# Patient Record
Sex: Male | Born: 1940 | Race: White | Hispanic: No | Smoking: Current every day smoker
Health system: Southern US, Community
[De-identification: ages and names within clinical notes are randomized; demographics above are authoritative.]

## PROBLEM LIST (undated history)

## (undated) DIAGNOSIS — M1990 Unspecified osteoarthritis, unspecified site: Secondary | ICD-10-CM

## (undated) DIAGNOSIS — E559 Vitamin D deficiency, unspecified: Secondary | ICD-10-CM

## (undated) DIAGNOSIS — I739 Peripheral vascular disease, unspecified: Secondary | ICD-10-CM

## (undated) DIAGNOSIS — D751 Secondary polycythemia: Secondary | ICD-10-CM

## (undated) DIAGNOSIS — J449 Chronic obstructive pulmonary disease, unspecified: Secondary | ICD-10-CM

## (undated) DIAGNOSIS — E785 Hyperlipidemia, unspecified: Secondary | ICD-10-CM

## (undated) DIAGNOSIS — E119 Type 2 diabetes mellitus without complications: Secondary | ICD-10-CM

## (undated) DIAGNOSIS — K469 Unspecified abdominal hernia without obstruction or gangrene: Secondary | ICD-10-CM

## (undated) DIAGNOSIS — K5792 Diverticulitis of intestine, part unspecified, without perforation or abscess without bleeding: Secondary | ICD-10-CM

## (undated) DIAGNOSIS — I2510 Atherosclerotic heart disease of native coronary artery without angina pectoris: Secondary | ICD-10-CM

## (undated) DIAGNOSIS — I10 Essential (primary) hypertension: Secondary | ICD-10-CM

## (undated) HISTORY — DX: Chronic obstructive pulmonary disease, unspecified: J44.9

## (undated) HISTORY — DX: Type 2 diabetes mellitus without complications: E11.9

## (undated) HISTORY — DX: Peripheral vascular disease, unspecified: I73.9

## (undated) HISTORY — PX: COLON SURGERY: SHX602

## (undated) HISTORY — DX: Hyperlipidemia, unspecified: E78.5

## (undated) HISTORY — DX: Diverticulitis of intestine, part unspecified, without perforation or abscess without bleeding: K57.92

## (undated) HISTORY — DX: Unspecified abdominal hernia without obstruction or gangrene: K46.9

## (undated) HISTORY — DX: Secondary polycythemia: D75.1

## (undated) HISTORY — DX: Atherosclerotic heart disease of native coronary artery without angina pectoris: I25.10

## (undated) HISTORY — PX: PACEMAKER INSERTION: SHX728

## (undated) HISTORY — DX: Unspecified osteoarthritis, unspecified site: M19.90

## (undated) HISTORY — PX: CORONARY ARTERY BYPASS GRAFT: SHX141

## (undated) HISTORY — DX: Vitamin D deficiency, unspecified: E55.9

## (undated) HISTORY — PX: OTHER SURGICAL HISTORY: SHX169

## (undated) HISTORY — PX: CAROTID ENDARTERECTOMY: SUR193

## (undated) HISTORY — DX: Essential (primary) hypertension: I10

## (undated) HISTORY — DX: Other disorders of bilirubin metabolism: E80.6

---

## 1993-08-29 HISTORY — PX: OTHER SURGICAL HISTORY: SHX169

## 1998-03-06 ENCOUNTER — Other Ambulatory Visit: Admission: RE | Admit: 1998-03-06 | Discharge: 1998-03-06 | Payer: Self-pay | Admitting: Internal Medicine

## 1998-03-07 ENCOUNTER — Other Ambulatory Visit: Admission: RE | Admit: 1998-03-07 | Discharge: 1998-03-07 | Payer: Self-pay | Admitting: Internal Medicine

## 1998-09-04 ENCOUNTER — Ambulatory Visit (HOSPITAL_COMMUNITY): Admission: RE | Admit: 1998-09-04 | Discharge: 1998-09-04 | Payer: Self-pay | Admitting: Internal Medicine

## 1999-06-28 ENCOUNTER — Ambulatory Visit (HOSPITAL_COMMUNITY): Admission: RE | Admit: 1999-06-28 | Discharge: 1999-06-28 | Payer: Self-pay | Admitting: Gastroenterology

## 1999-06-28 ENCOUNTER — Encounter: Payer: Self-pay | Admitting: Gastroenterology

## 1999-09-24 ENCOUNTER — Ambulatory Visit (HOSPITAL_COMMUNITY): Admission: RE | Admit: 1999-09-24 | Discharge: 1999-09-24 | Payer: Self-pay | Admitting: Internal Medicine

## 1999-09-24 ENCOUNTER — Encounter: Payer: Self-pay | Admitting: Internal Medicine

## 2000-11-03 ENCOUNTER — Encounter: Payer: Self-pay | Admitting: Internal Medicine

## 2000-11-03 ENCOUNTER — Ambulatory Visit (HOSPITAL_COMMUNITY): Admission: RE | Admit: 2000-11-03 | Discharge: 2000-11-03 | Payer: Self-pay | Admitting: Internal Medicine

## 2001-11-09 ENCOUNTER — Encounter: Payer: Self-pay | Admitting: Internal Medicine

## 2001-11-09 ENCOUNTER — Ambulatory Visit (HOSPITAL_COMMUNITY): Admission: RE | Admit: 2001-11-09 | Discharge: 2001-11-09 | Payer: Self-pay | Admitting: Internal Medicine

## 2002-09-11 ENCOUNTER — Encounter: Payer: Self-pay | Admitting: Emergency Medicine

## 2002-09-11 ENCOUNTER — Emergency Department (HOSPITAL_COMMUNITY): Admission: EM | Admit: 2002-09-11 | Discharge: 2002-09-11 | Payer: Self-pay | Admitting: Emergency Medicine

## 2003-04-05 ENCOUNTER — Encounter: Payer: Self-pay | Admitting: *Deleted

## 2003-04-05 ENCOUNTER — Inpatient Hospital Stay (HOSPITAL_COMMUNITY): Admission: EM | Admit: 2003-04-05 | Discharge: 2003-04-14 | Payer: Self-pay | Admitting: Emergency Medicine

## 2003-04-09 ENCOUNTER — Encounter: Payer: Self-pay | Admitting: *Deleted

## 2003-04-10 ENCOUNTER — Encounter: Payer: Self-pay | Admitting: *Deleted

## 2003-04-11 ENCOUNTER — Encounter: Payer: Self-pay | Admitting: *Deleted

## 2003-04-12 ENCOUNTER — Encounter: Payer: Self-pay | Admitting: *Deleted

## 2003-04-13 ENCOUNTER — Encounter: Payer: Self-pay | Admitting: Surgery

## 2003-10-03 ENCOUNTER — Encounter (INDEPENDENT_AMBULATORY_CARE_PROVIDER_SITE_OTHER): Payer: Self-pay | Admitting: *Deleted

## 2003-10-03 ENCOUNTER — Inpatient Hospital Stay (HOSPITAL_COMMUNITY): Admission: RE | Admit: 2003-10-03 | Discharge: 2003-10-04 | Payer: Self-pay | Admitting: Vascular Surgery

## 2004-11-30 ENCOUNTER — Ambulatory Visit (HOSPITAL_COMMUNITY): Admission: RE | Admit: 2004-11-30 | Discharge: 2004-11-30 | Payer: Self-pay | Admitting: Internal Medicine

## 2005-12-05 ENCOUNTER — Ambulatory Visit (HOSPITAL_COMMUNITY): Admission: RE | Admit: 2005-12-05 | Discharge: 2005-12-05 | Payer: Self-pay | Admitting: Internal Medicine

## 2006-12-12 ENCOUNTER — Ambulatory Visit (HOSPITAL_COMMUNITY): Admission: RE | Admit: 2006-12-12 | Discharge: 2006-12-12 | Payer: Self-pay | Admitting: Internal Medicine

## 2008-01-23 ENCOUNTER — Ambulatory Visit (HOSPITAL_COMMUNITY): Admission: RE | Admit: 2008-01-23 | Discharge: 2008-01-23 | Payer: Self-pay | Admitting: Internal Medicine

## 2008-01-28 LAB — HM COLONOSCOPY

## 2008-02-06 ENCOUNTER — Ambulatory Visit: Payer: Self-pay | Admitting: Gastroenterology

## 2008-02-20 ENCOUNTER — Ambulatory Visit: Payer: Self-pay | Admitting: Gastroenterology

## 2009-03-10 ENCOUNTER — Ambulatory Visit (HOSPITAL_COMMUNITY): Admission: RE | Admit: 2009-03-10 | Discharge: 2009-03-10 | Payer: Self-pay | Admitting: Internal Medicine

## 2010-03-11 ENCOUNTER — Ambulatory Visit (HOSPITAL_COMMUNITY): Admission: RE | Admit: 2010-03-11 | Discharge: 2010-03-11 | Payer: Self-pay | Admitting: Internal Medicine

## 2010-08-17 ENCOUNTER — Ambulatory Visit (HOSPITAL_COMMUNITY)
Admission: RE | Admit: 2010-08-17 | Discharge: 2010-08-18 | Payer: Self-pay | Source: Home / Self Care | Attending: Cardiovascular Disease | Admitting: Cardiovascular Disease

## 2010-11-08 LAB — GLUCOSE, CAPILLARY
Glucose-Capillary: 186 mg/dL — ABNORMAL HIGH (ref 70–99)
Glucose-Capillary: 195 mg/dL — ABNORMAL HIGH (ref 70–99)

## 2011-01-14 NOTE — Discharge Summary (Signed)
NAME:  Tim Lyons, Tim Lyons NO.:  [id number]   MEDICAL RECORD NO.:  [id number]                   PATIENT TYPE:  INP   LOCATION:  3301                                 FACILITY:  [facility]   PHYSICIAN:  Quita Skye. Hart Rochester, M.D.               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   ADDITIONAL/DISCHARGE DIAGNOSES:  1. History of coronary artery disease status post coronary artery bypass     grafting in 1996.  2. Noninsulin-dependent diabetes mellitus.  3. Hypertension.  4. Hypercholesterolemia.  5. Status post pacemaker insertion in July of 2005.  6. History of abdominal hernia repair with mesh.  7. Status post colon resection and colostomy for ruptured for diverticular     disease.  8. Status post appendectomy.  9. Status post right shoulder separation and left elbow surgery.  10.      Status post ___________ .  11.      Severe carotid occlusive disease status post left carotid     endarterectomy completed on October 03, 2003.   HOSPITAL PROCEDURES/MANAGEMENT:  1. Left carotid endarterectomy with Dacron patch angioplasty completed by     Dr. Hart Rochester on October 03, 2003.   CONSULTATIONS:  None.   HISTORY OF PRESENT ILLNESS:  Mr. Mesa is a 70 year old gentleman with a  history of coronary artery disease, having undergone coronary artery bypass  grafting in 1996. The patient presented with no specific neurological  symptoms other than some mild dizziness. He had no history of CVA or  hemispheric TIAs in the past. The patient did have an episode a few years  ago of some visual changes in the right eye which were thought to represent  migraine equivalent. The patient was found to have a carotid bruit on the  left by Dr.  Clarene Duke, and carotid Duplex exam revealed a right internal  carotid occlusion, and a 90% left internal carotid artery stenosis. The  patient was referred to Dr. Hart Rochester for further evaluation.   Mr. Heo was seen in the [facility] office by Dr. Hart Rochester on September 30, 2003  for evaluation of her known severe carotid occlusive disease. Dr. Hart Rochester  agreed that patient did have severe cerebrovascular disease with right  internal carotid occlusion and severe left internal carotid stenosis which  was asymptomatic. We felt that the patient would benefit from an elective  left carotid endarterectomy. The risks, benefits, and alternatives were  discussed with the patient at this time, and he understand and agreed to  proceed with surgery.   HOSPITAL COURSE:  Mr. Toft was admitted to [facility] on  October 03, 2003 and underwent left carotid endarterectomy with Dacron patch  angioplasty. This  was completed by Dr. Hart Rochester. The patient was extubated  successfully on the operating room table without difficulty. He was then  transferred to the post anesthesia care unit in stable condition. Once  awake, alert, and appropriate, the patient was then transferred to the 3300  for further care.   The patient remained hemodynamically stable and neurologically intact  throughout the evening of his surgery. On postoperative day #1, he continued  to do quite well. He had very minimum pain. He was urinating without  difficulty. He had a good appetite and tolerated his breakfast. He was  afebrile and saturating in the low 90s on room air. The patient denied any  shortness of breath or wheezing. He had good urine output with 975 cc  overnight. Lab values were all within normal limits. The patient's incision  was clean, dry, and intact without erythema, swelling, or drainage. He was  neurologically intact. The patient was deemed appropriate for discharge home  on that day. He was encouraged to utilize his  incentive spirometer and  follow aggressive pulmonary toilet while at home for the next several days.   DISCHARGE MEDICATIONS:  1. Aspirin 81 mg daily.  2. Lipitor 40 mg daily.  3. Effexor XR 75 mg daily.  4. Coreg 3.125 mg twice a day.  5. Altace 5 mg daily.  6. Glyburide 5 mg tid  7. Atenolol 100 mg q.h.s.  8. Tylox one to two tablets every four hours as needed for pain.   DISCHARGE INSTRUCTIONS:  1. Activity:  The patient should avoid driving. He should avoid heavy     lifting or strenuous activity. He should continue his breathing exercises     daily. He should continue to walk daily.  2. Diet:  The patient should follow a constant carbohydrate, low     cholesterol, low salt diet.  3. Wound care:  The patient can shower daily. He should wash his incisions     daily with soap and water. He should notify the [facility] office if he has any     increased redness, swelling, or drainage from his incisions.   FOLLOWUP APPOINTMENT:  The patient is to see Dr. Hart Rochester on Tuesday, February  15 at 9 a.m.      Carolyn A. Eustaquio Boyden.                  Quita Skye Hart Rochester, M.D.    CAF/MEDQ  D:  11/19/2003  T:  11/21/2003  Job:  [id number]   cc:   Lucky Cowboy, M.D.  [address]  [place], [place] [address]  Fax: [phone]   Clarene Duke, M.D.

## 2011-01-14 NOTE — Consult Note (Signed)
NAME:  Tim Lyons, Tim Lyons NO.:  [id number]   MEDICAL RECORD NO.:  [id number]                   PATIENT TYPE:  INP   LOCATION:  2923                                 FACILITY:  [facility]   PHYSICIAN:  Mikey Bussing, M.D.           DATE OF BIRTH:  [birth year]   DATE OF CONSULTATION:  04/10/2003  DATE OF DISCHARGE:                                   CONSULTATION   REQUESTING PHYSICIAN:  Richard A. Alanda Amass, M.D.   REASON FOR CONSULTATION:  Left pneumothorax.   CHIEF COMPLAINT:  Shortness of breath.   PRESENT ILLNESS:  The patient is a 70 year old white male who was admitted  to the hospital through the emergency room on April 05, 2003, hypotensive,  bradycardic and with hypoxia.  He was found to be in complete heart block  with low O2 saturations.  Because he had undergone previous coronary bypass  grafting, he underwent urgent cardiac catheterization, placement of a  temporary transvenous pacemaker, and placement of intra-aortic balloon pump.  His cardiac catheterization demonstrated patent grafts with ejection  fraction of 35%.  He clinically improved after his heart rate was increased  and he was subsequently weaned and separated from balloon pump.  On April 08, 2003, he underwent a transvenous pacemaker by Dr. Susa Griffins for  his complete heart block.  Following the procedure, the patient had a small  10% pneumothorax which, on serial chest x-rays, increased to 25%.  He was  requiring oxygen therapy to keep his saturations in a normal range and it  was felt that a thoracic surgical evaluation for potential chest tube  placement was indicated.   PAST MEDICAL HISTORY:  1. Complete heart block on admission requiring permanent transvenous     pacemaker.  2. Coronary bypass grafting x6 in 1996.  3. Acute depression with recent death of his wife two weeks ago.  4. Active smoking.  5. Hypertension.  6. Hyperlipidemia.  7. Adult-onset  diabetes.   SOCIAL HISTORY:  The patient is widowed.  He smokes a half a pack of  cigarettes a day.  He has a past history of alcohol use.   PHYSICAL EXAMINATION:  GENERAL:  The patient is a middle-aged white male, 5  feet 5 inches, and he weighs 150 pounds.  VITAL SIGNS:  On exam, blood pressure is 110/70, heart rate is 72, paced,  and his oxygen saturation on nasal cannula is 94%.  HEENT:  He is normocephalic.  EOMs are full.  NECK:  Neck is without crepitus or mass.  LUNGS:  Breath sounds are diminished on the left side.  He had a recent  pacemaker pocket beneath the left clavicle which is without hematoma or  swelling.  CARDIAC:  Exam reveals a regular rhythm without S3 gallop or murmur.  ABDOMEN:  Soft.  EXTREMITIES:  Extremities are without clubbing or cyanosis or edema.  NEUROLOGIC:  He is alert and  responsive and moves all extremities.  Peripheral pulses are 1+.   LABORATORY DATA:  I reviewed his chest x-ray taken this morning and he has a  lateral 25% pneumothorax.  Otherwise, his chest x-ray shows changes  following his remote coronary bypass surgery and a recent transvenous  pacemaker placement.   DIAGNOSIS:  Left pneumothorax, 25%, following transvenous pacemaker  placement.   PLAN:  A left chest tube was placed in the CCU at the time of the exam and  under local anesthesia and sterile prep and drape, a 20-French chest tube  was placed through the 5th interspace and positioned to the apex of the left  hemithorax.  A chest x-ray following the procedure showed complete  resolution of the pneumothorax with the chest tube in good position.   The patient will be left on Pleur-evac suction for the next 24 to 48 hours.  We will follow the patient with daily chest x-rays and remove the chest  tube, once the air leak and pneumothorax have resolved.   Thank you very much for this consultation.                                               Mikey Bussing, M.D.     PV/MEDQ  D:  04/10/2003  T:  04/12/2003  Job:  [id number]

## 2011-01-14 NOTE — H&P (Signed)
NAME:  Tim Lyons, Tim Lyons NO.:  [id number]   MEDICAL RECORD NO.:  [id number]                   PATIENT TYPE:  INP   LOCATION:                                       FACILITY:  [facility]   PHYSICIAN:  Quita Skye. Hart Rochester, M.D.               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   HISTORY OF PRESENT ILLNESS:  This 70 year old gentleman with a history of  coronary artery disease, having undergo coronary artery bypass grafting in  1996, now presents with no specific neurologic symptoms other than some mild  dizziness.  He has had no history of CVA or hemispheric TIAs in the past.  He did have an episode a few years ago of some visual changes in the right  eye which were thought to represent migraine equivalence.  He was recently  found to have a carotid bruit on the left by Dr. Clarene Duke and carotid duplex  exam revealed a right internal carotid occlusion and 90% left internal  carotid stenosis.  He was referred for further evaluation.   PAST MEDICAL HISTORY:  1. Noninsulin-dependent diabetes mellitus.  2. Hypertension.  3. Hypercholesterolemia.  4. Coronary artery disease, status post coronary artery bypass grafting in     1996.   He denies chronic obstructive pulmonary disease, stroke, deep venous  thrombosis, and pulmonary emboli.   PAST SURGICAL HISTORY:  1. Left elbow surgery.  2. Right shoulder separation.  3. Appendectomy.  4. Colon resection and colostomy for ruptured diverticular disease.  5. T&A.  6. Abdominal hernia repair with mesh.  7. Pacemaker insertion in July of 2005.  8. PTCA in 1989.   FAMILY HISTORY:  Positive for coronary artery disease in his grandparents.  Negative for stroke or diabetes.   SOCIAL HISTORY:  He  is a widowed individual who has one child and is  retired.  Smokes one-half of a packs of cigarettes per day currently and has  smoked at least that much in the past.  Does not use alcohol.   REVIEW OF SYSTEMS:  Negative for cardiac symptoms such as PND, orthopnea,  dyspnea on exertion, and chest pain.  Has no pulmonary symptoms, such as  chronic productive cough, bronchitis, hemoptysis, or wheezing.  He does have  a history of urinary frequency and occasional lower extremity discomfort in  bed.   ALLERGIES:  SULFA.   MEDICATIONS:  1. Aspirin 81 mg per day.  2. Lipitor 40 mg once a day.  3. Effexor XR 75 mg once a day.  4. Coreg 3.125 mg twice a day.  5. Altace 5 mg a day.  6. Glyburide 5 mg t.i.d.  7. Atenolol 100 mg q.h.s.  PHYSICAL EXAMINATION:  VITAL SIGNS:  Blood pressure 148/80 in the left arm  and 158/80 in the right arm, heart rate 84, respirations 18.  GENERAL APPEARANCE:  He is a healthy-appearing male who is in no apparent  distress.  He is alert and oriented x 3.  NECK:  Supple with 3+ carotid pulses palpable.  No bruit is present on the  right.  There is a harsh, high-pitched bruit over the left carotid  bifurcation.  NEUROLOGIC:  Exam is normal.  NECK:  There is no palpable adenopathy.  There is no thyromegaly.  EXTREMITIES:  The upper extremity pulses are 2-3+ at the brachial and radial  level.  He has 3+ femoral, 2+ popliteal, and 2+ posterior tibial pulses  palpable bilaterally with no evidence of edema or ischemia.  CHEST:  Clear to auscultation.  CARDIOVASCULAR:  Regular rhythm.  No murmurs.  ABDOMEN:  Soft and nontender with no mass.   LABORATORY FINDINGS:  A 2-D echocardiogram in December of 2004 revealed an  ejection fraction of 50-55%.  A cardiac catheterization in August of 2004  revealed all grafts were patent (four grafts).  Carotid duplex exam at [facility]  and [facility] revealed right internal carotid  occlusion and 90% left  internal carotid stenosis.   IMPRESSION:  1. Severe cerebrovascular disease with right internal carotid occlusion and     severe left internal carotid stenosis, asymptomatic.  2. Coronary artery disease, status post coronary artery bypass grafting,     stable.  3. Noninsulin-dependent diabetes mellitus.  4. Hypertension.  5. History of perforated diverticular disease with colostomy.  6. History of pacemaker insertion.   PLAN:  The patient is to be admitted on Friday, October 03, 2003, for an  elected left carotid endarterectomy for this severe, but asymptomatic  stenosis.  The risks were fully discussed in the office and the patient  desires to proceed.                                                Quita Skye Hart Rochester, M.D.    JDL/MEDQ  D:  09/30/2003  T:  09/30/2003  Job:  [id number]   cc:   Thereasa Solo. Little, M.D.  [address]  [address]  [place]  [place] [address]  Fax: [phone]   Lucky Cowboy, M.D.  [address]  [place], [place] [address]  Fax: [phone]

## 2011-01-14 NOTE — Cardiovascular Report (Signed)
NAME:  Tim Lyons, Tim Lyons NO.:  [id number]   MEDICAL RECORD NO.:  [id number]                   PATIENT TYPE:  INP   LOCATION:  1826                                 FACILITY:  [facility]   PHYSICIAN:  Darlin Priestly, M.D.             DATE OF BIRTH:  1941/08/16   DATE OF PROCEDURE:  04/05/2003  DATE OF DISCHARGE:                              CARDIAC CATHETERIZATION   PROCEDURE:  1. Left heart catheterization.  2. Coronary angiography.  3. Left ventriculogram.  4. Saphenous vein graft.  5. Left internal mammary artery angiography.  6. Right heart catheterization.  7. Insertion of a temporary transvenous pacing wire.  8. Ascending aortography.   COMPLICATIONS:  None.   INDICATIONS FOR PROCEDURE:  Tim Lyons is a 70 year old male patient of  Dr. Caprice Kluver with a history of  coronary artery bypass graft surgery  approximately 10 years ago and a history of PCI. The patient was brought  emergently to the emergency room on April 05, 2003, at approximately 15:30  after the patient called EMS complaining of shortness of breath, chest pain  and lethargy. Per the EMS report from the ER the patient was unable to walk  through the door and they found him semi responsive with a heart rate in the  20s to 30s and no palpable blood pressure. He was mentating, however. When  he arrived in the emergency room he was on 100% nonrebreather, satting in  the upper  90s with a transvenous Zoll pacer with a heart rate of 50 and a  blood pressure of 50 palpable. When the Zoll was turned to approximately a  30, the patient had no underlying rhythm and continued to pace at a rate of  30. Because of his ongoing hypotension, chest pain and complete heart  block, he is not brought emergently to the cardiac catheterization  laboratory for temporary transvenous pacer, cardiac catheterization and  possible insertion of intraaortic balloon pump.   DESCRIPTION OF PROCEDURE:  After  giving emergent consent, the patient was  brought to the cardiac catheterization laboratory  where a #6 French right  venous sheath was inserted into the right femoral vein and a #7 [place]  arterial sheath was inserted into the right femoral artery. Next under  fluoroscopic guidance a transvenous pacing wire was then floated to the RV  apex and thresholds were then determined. The patient was begun on IV  Dopamine as well as phenylephrine in an attempt to maintain systolic  pressures above 90.   A 6 French diagnostic catheter was then used to perform diagnostic  angiography. This reveals a medium sized left main with diffuse 80% distal  stenosis. The LAD appeared  to be a medium sized vessel with diffuse disease  in its proximal  portion. The mid and distal  LAD filled the patent LIMA and  with no significant disease in the LIMA or the distal IMA  insertion. There  was 1 diagonal branch which filled the patent saphenous vein graft with no  significant disease in the body of the graft  or distal  to the graft  insertion.   The left circumflex is a medium sized vessel which coursed in the AV groove  and gives rise to 2 obtuse marginal branches. The AV groove circumflex was a  small vessel with diffuse 80% proximal disease. There was 1 totally occluded  obtuse marginal proximally  and 1 lower obtuse marginal which was a small  vessel. There were 2 other obtuse marginals that filled via patent  sequential vein graft to the OM1 and OM2. There was no disease in the body  of the graft. The 1st OM appeared  to be widely patent. The 2nd OM was a  medium sized vessel which bifurcated distally and had a 50% proximal  stenosis in the lower bifurcation and a 60% proximal stenosis in the upper  bifurcation.   The right coronary artery was diffusely diseased and totally occluded in its  midportion. There was a patent saphenous vein graft to the PDA and  posterolateral branch. There was no significant  disease in the body of the  graft or distal  to the graft  insertion.   The left ventriculogram reveals moderately depressed ejection fraction of  35% to 40% with global hypokinesis. There is no significant mitral  regurgitation noted. Ascending aortography reveals mild dilatation of the  ascending aortic root with no evidence of dissection.   HEMODYNAMICS:  Right atrial pressure of 7, RV 29/2, PA 34/9, pulmonary  capillary wedge pressure 14, systemic arterial pressure 82/41, LV pressure  88/4, LVEDP 15, AA saturation 100%, PA saturation 66% on 100% nonrebreather.   CONCLUSIONS:  1. Significant left main and 3-vessel coronary artery disease.  2. Patent left internal mammary artery to the left anterior descending     artery.  3. Patent vein graft to the diagonal.  4. Patent vein graft to the obtuse marginal 1 and obtuse marginal 2 with     noncritical disease of the 2nd obtuse marginal.  5. Patent vein graft to the pulmonary artery and posterolateral branch.  6. Moderately depressed left ventricular systolic function.  7. No evidence of aortic dissection.  8. Hypotension.  9. Normal right heart  pressures.  10.      Temporary insertion of a transvenous pacer wire.                                               Darlin Priestly, M.D.    RHM/MEDQ  D:  04/05/2003  T:  04/05/2003  Job:  [id number]   cc:   Thereasa Solo. Little, M.D.  [address]  [place] [address]  Fax: [phone]

## 2011-01-14 NOTE — Op Note (Signed)
NAME:  Tim Lyons, Tim Lyons NO.:  [id number]   MEDICAL RECORD NO.:  [id number]                   PATIENT TYPE:  INP   LOCATION:  2923                                 FACILITY:  [facility]   PHYSICIAN:  Richard A. Alanda Amass, M.D.          DATE OF BIRTH:  [birth year]   DATE OF PROCEDURE:  04/08/2003  DATE OF DISCHARGE:                                 OPERATIVE REPORT   PREOPERATIVE DIAGNOSES:  1. Complete heart block requiring temporary transvenous pacemaker associated     with hypotension, obtunded, semiresponsive requiring TTVP on admission,     April 05, 2003.  2. Remote coronary artery bypass graft x6, Dr. Andrey Campanile, October 24, 1994.  3. Patent saphenous vein graft to diagonal, patent left internal mammary     artery to left anterior descending, patent sequential saphenous vein     graft to obtuse marginal, patent sequential saphenous vein graft to     posterior descending artery and posterior lateral artery on urgent     catheterization April 05, 2003, Dr. Jenne Campus.  Normal right heart     pressures.  Ejection fraction 35-40% with global hypokinesis with without     significant mitral regurgitation.  4. Temporary transvenous pacer.  Temporary intra-aortic balloon pump.  5. Acute depression with recent death of his wife two weeks ago.  6. Past history of ETOH.  7. Hyperlipidemia.  8. Systemic hypertension.  9. Adult onset diabetes mellitus.  10.      Chronic obstructive pulmonary disease, continued cigarette abuse.  11.      Remote percutaneous transluminal coronary angioplasty in 1990.   POSTOPERATIVE DIAGNOSIS:  1. Complete heart block requiring temporary transvenous pacemaker associated     with hypotension, obtunded, semiresponsive requiring TTVP on admission,     April 05, 2003.  2. Remote coronary artery bypass graft x6, Dr. Andrey Campanile, October 24, 1994.  3. Patent saphenous vein graft to diagonal, patent left internal mammary     artery to left  anterior descending, patent sequential saphenous vein     graft to obtuse marginal, patent sequential saphenous vein graft to     posterior descending artery and posterior lateral artery on urgent     catheterization April 05, 2003, Dr. Jenne Campus.  Normal right heart     pressures.  Ejection fraction 35-40% with global hypokinesis with without     significant mitral regurgitation.  4. Temporary transvenous pacer.  Temporary intra-aortic balloon pump.  5. Acute depression with recent death of his wife two weeks ago.  6. Past history of ETOH.  7. Hyperlipidemia.  8. Systemic hypertension.  9. Adult onset diabetes mellitus.  10.      Chronic obstructive pulmonary disease, continued cigarette abuse.  11.      Remote percutaneous transluminal coronary angioplasty in 1990.   PROCEDURE:  Implantation of permanent DDDR Medtronic Kappa KVR 901 pulse  generator serial number [id number] with IS-1 bipolar passive fixation  Medtronic steroid eluding 45 cm ventricular electrodes.   IMPLANTING PHYSICIAN:  Richard A. Alanda Amass, M.D.   COMPLICATIONS:  None.   ESTIMATED BLOOD LOSS:  Approximately 70 mL.   ANESTHESIA:  Valium 5 mg p.o. premedication, 1% Xylocaine.  Intermittent  fentanyl and Nubain for sedation during the procedure, a total of Nubain 10  mg, fentanyl 125 mcg.   INDICATIONS:  See admission H&P and catheterization report of April 05, 2003, for further details.   Atrial electrode:  Medtronic 5594 - 45 cm, SN:  [id number].  Ventricular electrode:  Medtronic 4092-52 cm:  SN:  [id number].  Magnet rate:  BOL equals 85.  ERI magnet rate drops to 65 with reversion to VVI mode.  Thresholds:  Bipolar ventricular:  R equals 18.9 mV, minimal threshold for  capture 0.4 V, impedance 660 ohms.  Unipolar ventricular:  R equals 11.4 mV, impedence if 464 ohms, minimal  threshold 0.2 V.  Atrial bipolar:  P equals 4.1 mV, impedance 464 ohms, threshold 0.6 V.  Unipolar atrial:  P equals 4.2 mV and  impedance 393 ohms, threshold 0.3 V.   DESCRIPTION OF PROCEDURE:  The patient was brought to the second floor CT  lab, room #6, in the post absorptive state.  After premedication with 5 mg  of Valium p.o.  The left chest was prepped and draped in the usual manner.  1% Xylocaine was used for local anesthesia.  A left infraclavicular  transverse incision was performed and brought down to the prepectoral fascia  using blunt dissection and electrocautery to control hemostasis.  There was  considerable oozing. Preoperative platelet count was 113,000.  INR was  normal and aspirin was on hold today.  A pulse generator pocket was formed  using blunt dissection.  Initial visualization of the deltopectoral groove  in the cephalic vein showed this to be quite small, and this was not  utilized.  A #18 thin wall needle was used for subclavian vein puncture  through the wound.  There was difficult access because the patient had  discomfort in the left shoulder that has apparently been chronic and several  sticks were required.  No air was obtained.  The subclavian vein was entered  and using tandem sheaths with stainless J-tipped guidewire the electrodes  were introduced in tandem.  There was difficulty with manipulation of the  atrial electrodes so this was removed and then positioned again through a  new #9 peel-away Cook introducer.  The ventricular electrode was positioned  in the RV apex and was stable.  The atrial electrode was positioned.  The  right atrial appendage confirmed by fluoroscopy and rotation maneuvers and  was stable.  The peel-away sheath was removed.  The guidewire was retained  until threshold testing was performed.  Therefore, threshold testing was  performed as outlined above showing excellent bipolar and unipolar  thresholds, P and R-wave measurements.  The guidewire was removed.  The #1  figure-of-eight silk suture was previously positioned around the insertion site.  This was  used to control hemostasis and to run a tissue sewing collar  and prevent migration.  The leads were thoroughly secured with two  interrupted #1 silk sutures and as silicone sewing collar for each  electrode.  The temporary transvenous pacer was removed under fluoroscopic  control and the venous sheath removed after the procedure was over in the  lab with pressure hemostasis of the right groin.  The pocket was irrigated  with 500 mg of kanamycin solution.  The generator was hooked to the  electrodes in proper AV sequence with a single hexnut tightened to each  electrode.  The generator was delivered at the pocket with the electrodes  looped behind and electrocautery was used to control hemostasis in the  pocket which had considerable oozing, but this was controlled. The generator  was loosely secured to the underlying muscle fascia with muscle fascia with  #1 suture to prevent migration.  Subcutaneous tissue was closed with two  separate running layers and 2-0 Dexon suture and the skin was closed with 5-  0 subcuticular Dexon sutures.  Steri-Strips were then applied.  Fluoroscopy  showed good position of the atrial and ventricular electrodes.  Sponge count  was correct.  The patient was transferred to the holding area for  postoperative programming in stable condition.   He was overriding his pacer at the time of the procedure and postprocedure.  The pacemaker will be programmed currently to backup mode watching his rate  carefully for any recurrent heart block.  He will now move to therapy for  his associated medical problems.                                               Richard A. Alanda Amass, M.D.    RAW/MEDQ  D:  04/08/2003  T:  04/09/2003  Job:  [id number]   cc:   Lucky Cowboy, M.D.  [address]  [place], [place] [address]  Fax: [phone]   Thereasa Solo. Little, M.D.  [address]  [place] [address]  Fax: [phone]

## 2011-01-14 NOTE — Op Note (Signed)
NAME:  Tim Lyons, Tim Lyons NO.:  [id number]   MEDICAL RECORD NO.:  [id number]                   PATIENT TYPE:  INP   LOCATION:  3301                                 FACILITY:  [facility]   PHYSICIAN:  Quita Skye. Hart Rochester, M.D.               DATE OF BIRTH:  1940-10-25   DATE OF PROCEDURE:  10/03/2003  DATE OF DISCHARGE:  10/04/2003                                 OPERATIVE REPORT   PREOPERATIVE DIAGNOSIS:  Severe left internal carotid stenosis and right  internal carotid occlusion--possible diffuse cerebral ischemia type  symptoms.   POSTOPERATIVE DIAGNOSIS:  Severe left internal carotid stenosis and right  internal carotid occlusion--possible diffuse cerebral ischemia type  symptoms.   OPERATION:  Left carotid endarterectomy with Dacron patch angioplasty.   SURGEON:  Quita Skye. Hart Rochester, M.D.   FIRST ASSISTANT:  Coral Ceo, P.A.   ANESTHESIA:  General endotracheal.   BRIEF HISTORY:  This patient was found to have a left carotid bruit, and  carotid duplex exam was performed, which revealed right internal carotid  occlusion and left internal carotid stenosis, approximating 90%.  He had  some vague cerebral symptoms, consisting of some mild dizziness but no  specific hemispheric-type TIAs or amaurosis fugax.  He was scheduled for a  left carotid endarterectomy.   PROCEDURE:  The patient was taken to the operating room and placed in the  supine position, at which time satisfactory general endotracheal anesthesia  was administered.  The left neck was prepped with Betadine scrub and  solution and draped in a routine sterile manner.  An incision was made along  the anterior border of the sternocleidomastoid muscle, carried down through  the subcutaneous tissue and platysma using the Bovie.  The common facial  vein and the external jugular vein was ligated with 3-0 silk ties and  divided, exposing the common, internal, and external carotid arteries.  Care  was taken  not to injure the vagus or hypoglossal nerves, both of which were  exposed.  There was calcified atherosclerotic plaque at the carotid  bifurcation, extending up the internal carotid artery about 3 cm.  The  distal vessel appeared normal.  A #10 shunt was prepared, and the patient  was heparinized.  The carotid vessels were occluded with vascular clamps.  A  longitudinal opening made in the common carotid with a 15 blade  and  extended up the internal carotid with the Pott scissors to a point distal to  the disease.  The plaque was heavily calcified and degenerative and at least  85-90% stenotic in severity with a lot of ulceration posteriorly.  The  distal vessel appeared normal.  A #10 shunt was inserted without difficulty,  re-establishing flow in about 2 minutes.  A standard endarterectomy was then  performed using the elevator and the Pott scissors with __________  endarterectomy of the external carotid.  The plaque feathered off the distal  internal  carotid artery nicely and not requiring tacking sutures.  The lumen  was thoroughly irrigated with heparin and saline.  All loose debris  carefully removed, and arteriotomy was closed with a patch using continuous  6-0 Prolene.  Prior to completion of the closure, the shunt was removed  after about 30 minutes of shunt time.  Following antegrade and retrograde  flushing, the closure was completed, re-establishing flow in the external  and internal branch.  The carotid was occluded for less  than two minutes before removal of the shunt.  Protamine was then given to  reverse the heparin.  Following adequate hemostasis, the wound was irrigated  with saline and closed in layers with Vicryl in a subcuticular fashion.  Sterile dressing applied.  The patient taken to the recovery room in  satisfactory condition.                                               Quita Skye Hart Rochester, M.D.    JDL/MEDQ  D:  10/03/2003  T:  10/04/2003  Job:  [id number]    cc:   Thereasa Solo. Little, M.D.  [address]  [address]  [place]  [place] [address]  Fax: [phone]   Lucky Cowboy, M.D.  [address]  [place], [place] [address]  Fax: [phone]

## 2011-01-14 NOTE — Discharge Summary (Signed)
NAME:  Tim Lyons, Tim Lyons NO.:  [id number]   MEDICAL RECORD NO.:  [id number]                   PATIENT TYPE:  INP   LOCATION:  2020                                 FACILITY:  [facility]   PHYSICIAN:  Madaline Savage, M.D.             DATE OF BIRTH:  01-Feb-1941   DATE OF ADMISSION:  04/05/2003  DATE OF DISCHARGE:  04/14/2003                                 DISCHARGE SUMMARY   DISCHARGE DIAGNOSES:  1. Syncope, likely secondary to profound bradycardia, resolved.  2. Status post transvenous pacemaker secondary to symptomatic bradycardia.  3. Known coronary artery disease status post coronary artery bypass     grafting.  4. Status post pneumothorax as a complication of pacemaker lead insertion,     resolved.  5. Hypertension.  6. Hyperlipidemia.  7. Left ventricular dysfunction with ejection fraction 35 to 40%.  8. Noninsulin-dependent diabetes mellitus.  9. Depression.   HISTORY OF PRESENT ILLNESS:  This is a 70 year old Caucasian gentleman,  patient of Dr. Clarene Duke, who was delivered to the emergency room by EMS after  he was found at home hypertensive and unresponsive. In the emergency room,  he was assessed by Dr. Jenne Campus and was taken emergently to cardiac  catheterization for coronary angiography.   The procedure was performed by Dr. Jenne Campus on April 05, 2003, and it showed  patent grafts and diffuse native coronary disease. His 2-D echocardiogram  was done at time of catheterization and revealed mildly to moderately  reduced left ventricular function with ejection fraction of 35 to 40%.  Aortic valve did not show any signs of stenosis or venous insufficiency.  Mitral valve with mild MR and mildly depressed right ventricular function.  The patient was given fluids for resuscitation and next mourning was found  to be with improved blood pressure, 135/60, heart rate 52.   He remained bradycardic with pulse fluctuating between 42 to 54, and he was  scheduled for pacemaker implant by Dr. Alanda Amass on April 08, 2003. He  underwent the procedure at the scheduled day and had Medtronic Kappa  pacemaker implanted with a serial #[id number], atrial lead serial  #[id number], and ventricle lead serial #[id number], mode-DDDR. He tolerated  the procedure well. Subsequent pacer interrogation did not reveal any  problems. It became evident from the patient's history after he regained  consciousness he lost his wife approximately two weeks ago and is still in  mourning process. We requested psychiatric consult, and Dr. Jeanie Sewer saw  the patient. He diagnosed him with depression and started him on Lexapro.  His recommendations were to continue with Lexapro and with outpatient  psychiatric followup, and patient chose to go through the hospice and see  what type of counseling they can offer for him. If he changes his mind, then  he can followup with outpatient [facility].   After pacer was inserted, the chest x-ray next morning showed pneumothorax  of  the left lung, and we asked CVTS to assess the patient for possible chest  tube placement. His chest tube was placed on April 09, 2003, and followup  chest x-ray showed resolution of the pneumothorax, and a couple of days  later on August 15, the chest tube was removed, and the patient continued to  be stable.   HOSPITAL LABORATORY DATA AND PERTINENT STUDIES:  CBC count with white blood  cell count 8.6, hemoglobin 13.4, hematocrit 58.8, platelets 210,000.  BMP  showed sodium of 139, potassium 4.2, chloride 106, CO2 28, glucose 122, BUN  9, creatinine 0.9, calcium 8.9. His PSA was 0.60 which was normal.  Hemoglobin A1c was 6.1, normal. Hepatic function panel was normal. Lipid  profile shows total cholesterol 123, triglycerides 102, HDL 40, and LDL 63.  TSH was 3.951, normal. Cardiac panel showed normal creatine, elevated CK-MB  of 11, and troponin 0.07. Second set:  CK-MB 6.8,  troponin 0.06, and still  normal creatine kinase.  The day of discharge, the patient was assessed by  Dr. Elsie Lincoln, found to be stable from cardiovascular standpoint.   DISCHARGE MEDICATIONS:  1. Lexapro 10 mg daily.  2. Norvasc 10 mg daily.  3. Altace 5 mg daily.  4. Toprol-XL 50 mg daily.  5. Aspirin 81 mg daily.  6. Glucophage 500 mg b.i.d.  7. Instructed to resume cholesterol lowering medications as at home as     before.   DISCHARGE DISPOSITION:  Discharged to home.   ACTIVITY:  No exertional activities. No overhead reaching. No driving up  until seen in the office. He was instructed not to wet incision site, keep  it dry up until seen in the office.   DISCHARGE DIET:  Low cholesterol, low carbohydrate diet.   FOLLOWUP:  The patient was provided with the phone number and address in our  office and instructed to report any problems to [place], and also he will be seen  by a nurse practitioner at our office on April 18, 2003 at 10 a.m.      [name], P.A.                    Madaline Savage, M.D.    MK/MEDQ  D:  04/14/2003  T:  04/14/2003  Job:  [id number]   cc:   Thereasa Solo. Little, M.D.  [address]  [place] [address]  Fax: [phone]   Lucky Cowboy, M.D.  [address]  [place], [place] [address]  Fax: [phone]

## 2011-03-17 ENCOUNTER — Other Ambulatory Visit (HOSPITAL_COMMUNITY): Payer: Self-pay | Admitting: Internal Medicine

## 2011-03-17 ENCOUNTER — Ambulatory Visit (HOSPITAL_COMMUNITY)
Admission: RE | Admit: 2011-03-17 | Discharge: 2011-03-17 | Disposition: A | Discharge status: Home / Self Care | Payer: Medicare Other | Source: Ambulatory Visit | Attending: Internal Medicine | Admitting: Internal Medicine

## 2011-03-17 DIAGNOSIS — I10 Essential (primary) hypertension: Secondary | ICD-10-CM

## 2011-03-17 DIAGNOSIS — F17200 Nicotine dependence, unspecified, uncomplicated: Secondary | ICD-10-CM | POA: Insufficient documentation

## 2011-03-17 DIAGNOSIS — Z950 Presence of cardiac pacemaker: Secondary | ICD-10-CM | POA: Insufficient documentation

## 2011-03-31 ENCOUNTER — Other Ambulatory Visit: Payer: Self-pay | Admitting: Internal Medicine

## 2011-04-01 ENCOUNTER — Other Ambulatory Visit: Payer: Self-pay | Admitting: Family Medicine

## 2011-04-04 ENCOUNTER — Ambulatory Visit
Admission: RE | Admit: 2011-04-04 | Discharge: 2011-04-04 | Disposition: A | Discharge status: Home / Self Care | Payer: Medicare Other | Source: Ambulatory Visit | Attending: Family Medicine | Admitting: Family Medicine

## 2011-04-12 ENCOUNTER — Encounter (INDEPENDENT_AMBULATORY_CARE_PROVIDER_SITE_OTHER): Payer: Self-pay | Admitting: General Surgery

## 2011-04-21 ENCOUNTER — Encounter (INDEPENDENT_AMBULATORY_CARE_PROVIDER_SITE_OTHER): Payer: Self-pay | Admitting: General Surgery

## 2011-04-21 ENCOUNTER — Ambulatory Visit (INDEPENDENT_AMBULATORY_CARE_PROVIDER_SITE_OTHER): Payer: Self-pay | Admitting: General Surgery

## 2011-04-21 VITALS — BP 128/82 | HR 72 | Temp 96.8°F | Ht 64.0 in | Wt 132.6 lb

## 2011-04-21 DIAGNOSIS — C4359 Malignant melanoma of other part of trunk: Secondary | ICD-10-CM

## 2011-04-21 DIAGNOSIS — D235 Other benign neoplasm of skin of trunk: Secondary | ICD-10-CM

## 2011-04-21 DIAGNOSIS — D225 Melanocytic nevi of trunk: Secondary | ICD-10-CM

## 2011-04-21 NOTE — Patient Instructions (Signed)
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 This will need to be excised with a wider margin at some point in the near future. You also have a pigmented lesion on your right upper back which we biopsied today to prove whether it is also a melanoma or not.  We will have that report early next week. I will see you back in the office next week and we will decide how to manage both of these areas surgically.

## 2011-04-21 NOTE — Progress Notes (Signed)
Chief Complaint  Patient presents with  . Other    Maligant melanoma of right chest. Dr. Jilda Roche office faxing records.    HPI Tim Lyons is a 70 y.o. male.    This gentleman was referred to me by Dr. Janyth Contes for evaluation of malignant melanoma of the right anterior chest wall.  The patient is not a good historian. He has had a pigmented nevus of his right anterior chest wall medial to the nipple for some time. Dr. Harlen Labs excised this in the office recently. Pathology report shows that this is a superficial spreading malignant melanoma, Clark's level III, Breslow's measurement 0.60 mm. There is some regression but no other adverse components. Pathologic stage was T1a. He is referred for wide local excision.  He also states he has a pigmented nevus on his back.  Significant comorbidities are that he is an active smoker, hypertension, coronary artery disease, status post coronary artery bypass graft, COPD, polycythemia, colectomy for diverticulosis, peripheral vascular disease.  He takes aspirin, verapamil, atenolol, glyburide, doxazosin, and pravastatin.  HPI  Past Medical History  Diagnosis Date  . Hypertension   . CAD (coronary artery disease)   . COPD (chronic obstructive pulmonary disease)   . Polycythemia   . Bilirubinemia   . Diabetes mellitus   . PAD (peripheral artery disease)   . Hernia   . Diverticulitis   . Arthritis     Past Surgical History  Procedure Date  . Carotid endarterectomy   . Coronary artery bypass graft   . Medtronic   . Heart bypass 1995  . Pacemaker insertion   . Colon surgery     History reviewed. No pertinent family history.  Social History History  Substance Use Topics  . Smoking status: Current Everyday Smoker -- 0.5 packs/day  . Smokeless tobacco: Not on file  . Alcohol Use: Yes     2 beers    Allergies  Allergen Reactions  . Sulfonamide Derivatives     REACTION: upset stomach    Current Outpatient  Prescriptions  Medication Sig Dispense Refill  . aspirin 81 MG tablet Take 81 mg by mouth daily.        Marland Kitchen atenolol (TENORMIN) 100 MG tablet Take 100 mg by mouth daily.        . Cholecalciferol (VITAMIN D PO) Take 2,500 Units by mouth 1 day or 1 dose.        Marland Kitchen doxazosin (CARDURA) 8 MG tablet Take 8 mg by mouth at bedtime.        Marland Kitchen glyBURIDE (DIABETA) 5 MG tablet Take 5 mg by mouth 3 (three) times daily.        . pravastatin (PRAVACHOL) 40 MG tablet Take 40 mg by mouth daily.        . verapamil (CALAN) 120 MG tablet Take 120 mg by mouth 3 (three) times daily.          Review of Systems Review of Systems  Constitutional: Negative.   HENT: Negative.   Eyes: Negative.   Gastrointestinal: Negative.   Genitourinary: Negative.   Musculoskeletal: Negative.   Skin: Negative.   Neurological: Negative.   Endo/Heme/Allergies: Negative.   Psychiatric/Behavioral: Negative.     Blood pressure 128/82, pulse 72, temperature 96.8 F (36 C), temperature source Temporal, height 5\' 4"  (1.626 m), weight 132 lb 9.6 oz (60.147 kg).  Physical Exam Physical Exam  Constitutional: He is oriented to person, place, and time. No distress.  HENT:  Head: Normocephalic.  Nose: Nose  normal.  Mouth/Throat: No oropharyngeal exudate.  Eyes: Conjunctivae are normal. Pupils are equal, round, and reactive to light. No scleral icterus.  Neck: Normal range of motion. Neck supple. No JVD present. No tracheal deviation present. No thyromegaly present.  Respiratory:      GI: Soft. Bowel sounds are normal. He exhibits no distension and no mass. There is no tenderness. There is no rebound and no guarding.    Musculoskeletal: He exhibits no tenderness.  Lymphadenopathy:    He has no cervical adenopathy.  Neurological: He is alert and oriented to person, place, and time.  Skin: Skin is dry. No rash noted. He is not diaphoretic. No erythema. No pallor.       See chest diagrams   Psychiatric: He has a normal mood and  affect. His behavior is normal. Judgment and thought content normal.       poor historian, mild memory impairment.     Data Reviewed I reviewed Dr. Deborha Payment office notes and the pathology report. There is also a discharge summary from December 2011 at which time he had his pacemaker changed.  Assessment    Malignant melanoma, right anterior chest wall. Superficial spreading type, Breslows  measurement 0.60 mm. This will need wide local excision, but there is no indication for sentinal node biopsy.  Suspicious, irregular pigmented and nevus, right back overlying the upper trapezius area, biopsied today.  Coronary artery disease, status post coronary bypass grafting.  Sinus post pacemaker placement.  COPD.  Hypertension.  Active tobacco abuse.  Peripheral vascular disease.  Status post colectomy for diverticulosis, records pending.    Plan    And obtain report of right back biopsies.  Return to see me in one week and we will discuss the appropriate surgical management of the melanoma of his anterior chest, and of the nevus on his back.       Ellorie Kindall M 04/21/2011, 1:36 PM

## 2011-04-25 ENCOUNTER — Telehealth (INDEPENDENT_AMBULATORY_CARE_PROVIDER_SITE_OTHER): Payer: Self-pay

## 2011-04-25 NOTE — Telephone Encounter (Signed)
LMOM to give pt path result as benign. Pt to call back for result.

## 2011-04-25 NOTE — Telephone Encounter (Signed)
Pt notified of path result and that we have requested cardiac clearance.

## 2011-05-04 ENCOUNTER — Encounter (INDEPENDENT_AMBULATORY_CARE_PROVIDER_SITE_OTHER): Payer: Self-pay | Admitting: General Surgery

## 2011-05-04 ENCOUNTER — Ambulatory Visit (INDEPENDENT_AMBULATORY_CARE_PROVIDER_SITE_OTHER): Payer: Medicare Other | Admitting: General Surgery

## 2011-05-04 VITALS — BP 128/70 | HR 66 | Temp 96.7°F

## 2011-05-04 DIAGNOSIS — C4359 Malignant melanoma of other part of trunk: Secondary | ICD-10-CM

## 2011-05-04 DIAGNOSIS — D239 Other benign neoplasm of skin, unspecified: Secondary | ICD-10-CM

## 2011-05-04 DIAGNOSIS — D229 Melanocytic nevi, unspecified: Secondary | ICD-10-CM

## 2011-05-04 NOTE — Patient Instructions (Signed)
The biopsy of the pigmented nevus on your back does not show cancer. It is a noncancerous pigmented nevus. It will need to be excised. The malignant melanoma of your anterior chest wall needs to be excised with a 2 cm margin if  possible. We do not need to do any lymph node surgery. We will schedule the surgery in the near future. Your cardiologist has given clearance for this surgery.

## 2011-05-04 NOTE — Progress Notes (Signed)
Subjective:     Patient ID: Tim Lyons, male   DOB: 1941/06/09, 70 y.o.   MRN: [id number]  HPI The pathology report from the biopsy I performed on the pigmented nevus on his right upper back shows hemangioma and melanocytic nevus, intradermal type. There was no appreciable atypia.  I discussed this with the patient today and advised him to have the melanoma from his right anterior chest wall excised with a 2 cm margin, and I advised him to have the pigmented nevus on his right upper back excised conservatively. He agrees with this.I do not think he needs a SLN biopsy.  I have received a cardiac clearance from his cardiologist, Dr. Royann Shivers  from [facility].  Review of Systems 12 system review of systems is performed and is negative except as described above.    Objective:   Physical Exam Constitutional - the patient is alert and in no distress.  Neurological-  he has mild memory deficit for the names of his physicians and the names of his medications. Skin-right upper back biopsy site looks fine. The pigmented nevus is mostly intact. I removed the suture.  Melanoma site in the right anterior chest medially overlying the pectoralis major muscle  has healed.  . I think that an oblique elliptical incision will accomplish a wide local excision.  Chest lungs are clear. 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    Assessment:     Malignant melanoma right anterior chest, 0.6 mm thickness., Status post conservative excision. Wide local excision indicated.  Pigmented nevus right upper back, conservative excision is advised.    Plan:        We will schedule for wide local excision of the malignant melanoma of the right anterior chest wall. We will try to achieve 2 cm margins through an oblique elliptical incision. There is no indication for sentinel lobe biopsy.  At the same setting we will excise the pigmented nevus of the right upper back. This will be a  relatively conservative excision.  Patient is advised to discontinue his aspirin 3-4 days preop.  Cardiac clearance has been obtained.

## 2011-05-10 ENCOUNTER — Encounter (HOSPITAL_COMMUNITY)
Admission: RE | Admit: 2011-05-10 | Discharge: 2011-05-10 | Disposition: A | Discharge status: Home / Self Care | Payer: Medicare Other | Source: Ambulatory Visit | Attending: General Surgery | Admitting: General Surgery

## 2011-05-10 LAB — CBC
Hemoglobin: 15.8 g/dL (ref 13.0–17.0)
MCH: 33 pg (ref 26.0–34.0)
Platelets: 182 10*3/uL (ref 150–400)
RBC: 4.79 MIL/uL (ref 4.22–5.81)
WBC: 10.1 10*3/uL (ref 4.0–10.5)

## 2011-05-10 LAB — BASIC METABOLIC PANEL
CO2: 28 mEq/L (ref 19–32)
Calcium: 9.7 mg/dL (ref 8.4–10.5)
Chloride: 101 mEq/L (ref 96–112)
Glucose, Bld: 326 mg/dL — ABNORMAL HIGH (ref 70–99)
Sodium: 138 mEq/L (ref 135–145)

## 2011-05-10 LAB — SURGICAL PCR SCREEN
MRSA, PCR: NEGATIVE
Staphylococcus aureus: NEGATIVE

## 2011-05-11 ENCOUNTER — Telehealth (INDEPENDENT_AMBULATORY_CARE_PROVIDER_SITE_OTHER): Payer: Self-pay

## 2011-05-11 NOTE — Telephone Encounter (Signed)
error 

## 2011-05-11 NOTE — Telephone Encounter (Signed)
I received msg from Dr Derrell Lolling that pts blood glucose pre op is 326. I have called and reported this and faxed the result to Dr Oneta Rack. They will call pt in to office asap. I have lmom for pt re: this.

## 2011-05-13 ENCOUNTER — Other Ambulatory Visit (INDEPENDENT_AMBULATORY_CARE_PROVIDER_SITE_OTHER): Payer: Self-pay | Admitting: General Surgery

## 2011-05-13 ENCOUNTER — Ambulatory Visit (HOSPITAL_COMMUNITY)
Admission: RE | Admit: 2011-05-13 | Discharge: 2011-05-13 | Disposition: A | Discharge status: Home / Self Care | Payer: Medicare Other | Source: Ambulatory Visit | Attending: General Surgery | Admitting: General Surgery

## 2011-05-13 DIAGNOSIS — D1801 Hemangioma of skin and subcutaneous tissue: Secondary | ICD-10-CM

## 2011-05-13 DIAGNOSIS — I10 Essential (primary) hypertension: Secondary | ICD-10-CM | POA: Insufficient documentation

## 2011-05-13 DIAGNOSIS — J449 Chronic obstructive pulmonary disease, unspecified: Secondary | ICD-10-CM | POA: Insufficient documentation

## 2011-05-13 DIAGNOSIS — I2510 Atherosclerotic heart disease of native coronary artery without angina pectoris: Secondary | ICD-10-CM | POA: Insufficient documentation

## 2011-05-13 DIAGNOSIS — F17200 Nicotine dependence, unspecified, uncomplicated: Secondary | ICD-10-CM | POA: Insufficient documentation

## 2011-05-13 DIAGNOSIS — J4489 Other specified chronic obstructive pulmonary disease: Secondary | ICD-10-CM | POA: Insufficient documentation

## 2011-05-13 DIAGNOSIS — C44509 Unspecified malignant neoplasm of skin of other part of trunk: Secondary | ICD-10-CM | POA: Insufficient documentation

## 2011-05-13 DIAGNOSIS — D45 Polycythemia vera: Secondary | ICD-10-CM | POA: Insufficient documentation

## 2011-05-13 DIAGNOSIS — Z01812 Encounter for preprocedural laboratory examination: Secondary | ICD-10-CM | POA: Insufficient documentation

## 2011-05-13 DIAGNOSIS — C44599 Other specified malignant neoplasm of skin of other part of trunk: Secondary | ICD-10-CM | POA: Insufficient documentation

## 2011-05-13 DIAGNOSIS — I739 Peripheral vascular disease, unspecified: Secondary | ICD-10-CM | POA: Insufficient documentation

## 2011-05-13 DIAGNOSIS — C4359 Malignant melanoma of other part of trunk: Secondary | ICD-10-CM

## 2011-05-13 LAB — GLUCOSE, CAPILLARY
Glucose-Capillary: 156 mg/dL — ABNORMAL HIGH (ref 70–99)
Glucose-Capillary: 129 mg/dL — ABNORMAL HIGH (ref 70–99)

## 2011-05-14 NOTE — Op Note (Signed)
NAME:  Tim Lyons, Tim Lyons NO.:  [id number]  MEDICAL RECORD NO.:  [id number]  LOCATION:  [facility]                         FACILITY:  [facility]  PHYSICIAN:  Angelia Mould. Derrell Lolling, M.D.DATE OF BIRTH:  1941/05/18  DATE OF PROCEDURE:  05/13/2011 DATE OF DISCHARGE:                              OPERATIVE REPORT   PREOPERATIVE DIAGNOSES: 1. Malignant melanoma, right anterior chest. 2. Pigmented nevus, right back.  POSTOPERATIVE DIAGNOSES: 1. Malignant melanoma, right anterior chest. 2. Pigmented nevus, right back.  OPERATION PERFORMED: 1. Wide local excision, malignant melanoma, right anterior chest with     layered closure, 10 cm x 6 cm elliptical incision. 2. Excision, 2-cm pigmented nevus of right back.  SURGEON:  Angelia Mould. Derrell Lolling, MD  OPERATIVE INDICATIONS:  This is a 70 year old gentleman with tobacco abuse, hypertension, coronary artery disease, COPD, polycythemia and peripheral vascular disease.  He recently was evaluated by Dr. Oneta Rack for a pigmented nevus of the right anterior chest.  This was excised conservatively in Dr. Kathryne Sharper office.  The pathology report showed that this was a superficial spreading malignant melanoma, Breslow's measurement 0.6 mm.  There was a little bit of regression, but no other adverse components.  The pathologic stage was T1a, NX.  When he was evaluated in the office, we also found that he had irregular pigmented nevus of his right upper back overlying the trapezius muscle.  I performed a punch biopsy of that in the office and that showed a melanocytic nevus, but no appreciable atypia.  He was brought to the operating room for wide local excision of the melanoma of his anterior right chest and a conservative excision of the pigmented nevus of his right upper back.  OPERATIVE TECHNIQUE:  Following the induction of general endotracheal anesthesia, the patient was positioned supine on the operating table with his right arm tucked at his  side.  The right anterior chest was prepped and draped in a sterile fashion.  Intravenous antibiotics were given.  Surgical time-out was identifying correct patient, correct procedure and correct site.  Marcaine 0.5% with epinephrine was used as a local infiltration anesthetic.  I used a ruler to measure and map out a 2-cm excision.  To get all the way around the old scar, this was an oblique incision beginning superomedially and ending inferolaterally. Incision wound up being about 10 cm in longitudinal dimension and about 6 cm in transverse dimension.  The incision was made with a knife.  The dissection was carried down to the pectoralis fascia.  The skin and subcutaneous tissue were dissected off of the pectoralis fascia with electrocautery.  Silk sutures were used to mark the margins to orient the pathologist and this was sent to the lab for routine histology.  I undermined the subcutaneous tissue to mobilize the skin flaps.  This went well.  Hemostasis was excellent and achieved electrocautery.  The wound was irrigated with saline.  Subcutaneous tissue was closed with multiple interrupted sutures of 3-0 Vicryl.  The skin was then closed with multiple interrupted sutures of 3-0 nylon.  A clean bandage was placed.  We then turned the patient in the left lateral decubitus position and supported him with pillows and a beanbag.  The lesion on the right upper back was observed and then this area was prepped and draped in a sterile fashion.  We then a second time-out identifying correct patient, correct procedure and correct site.  Marcaine 0.5% with epinephrine was used as a local infiltration anesthetic.  A transverse skin crease incision was made after marking the incision.  We tried to get about 3-4 mm margin all the way around this area.  Dissection was carried down into the deep subcutaneous tissue.  The specimen was marked with silk sutures to orient the pathologist.  Specimen was  removed and sent to the lab. Hemostasis was excellent and achieved electrocautery.  The wound was irrigated.  Skin was closed with interrupted sutures of 3-0 nylon. Clean bandage was placed.  The patient tolerated the procedure well, was taken recovery room in stable condition.  Estimated blood loss was probably about 15 mL total.  Sponge, needle and instrument counts were correct.     Angelia Mould. Derrell Lolling, M.D.     HMI/MEDQ  D:  05/13/2011  T:  05/13/2011  Job:  [id number]  cc:   Lucky Cowboy, M.D.  Electronically Signed by Claud Kelp M.D. on 05/14/2011 02:56:16 PM

## 2011-05-26 ENCOUNTER — Ambulatory Visit (INDEPENDENT_AMBULATORY_CARE_PROVIDER_SITE_OTHER): Payer: Medicare Other | Admitting: General Surgery

## 2011-05-26 ENCOUNTER — Encounter (INDEPENDENT_AMBULATORY_CARE_PROVIDER_SITE_OTHER): Payer: Self-pay | Admitting: General Surgery

## 2011-05-26 VITALS — BP 120/70 | HR 68 | Temp 97.1°F | Resp 16 | Ht 64.0 in | Wt 132.2 lb

## 2011-05-26 DIAGNOSIS — C4359 Malignant melanoma of other part of trunk: Secondary | ICD-10-CM | POA: Insufficient documentation

## 2011-05-26 DIAGNOSIS — Z9889 Other specified postprocedural states: Secondary | ICD-10-CM

## 2011-05-26 NOTE — Patient Instructions (Signed)
The melanoma on your chest wall has been completely excised. You do not need any further surgery. The pigmented nevus on your back was completely benign. I advise you to see a dermatologist every 6 months for surveillance for new melanomas. Return to see me in one year.

## 2011-05-26 NOTE — Progress Notes (Signed)
Subjective:     Patient ID: Tim Lyons, male   DOB: October 02, 1940, 70 y.o.   MRN: [id number]  HPI This 70 year old gentleman underwent excision of a pigmented nevus of his right upper back, and wide local reexcision of a melanoma of the right anterior chest on September 5. Final pathology report shows no residual melanoma of the chest wall and a benign hemangioma of the back. He's not had any wound healing problems. I discussed the pathology with him and gave him a copy of the report.  Review of Systems     Objective:   Physical Exam  The patient looks good and is in no distress. Strong odor of tobacco is present.  The right upper back wound and the right anterior chest and both healing nicely. No signs of hematoma or infection. All the sutures are removed.    Assessment:     Superficial spreading melanoma of right anterior chest, 0.6 mm thickness. Recovering uneventfully following wide local excision with good margins.  Benign hemangioma of right upper back. Recovering uneventfully.    Plan:     He was advised to see a dermatologist every 6 months for surveillance for recurrent or new melanomas. He was advised to discuss this with Dr. Oneta Rack  Return to see me in one year.

## 2012-03-21 ENCOUNTER — Ambulatory Visit (HOSPITAL_COMMUNITY)
Admission: RE | Admit: 2012-03-21 | Discharge: 2012-03-21 | Disposition: A | Discharge status: Home / Self Care | Payer: Medicare Other | Source: Ambulatory Visit | Attending: Internal Medicine | Admitting: Internal Medicine

## 2012-03-21 ENCOUNTER — Other Ambulatory Visit (HOSPITAL_COMMUNITY): Payer: Self-pay | Admitting: Internal Medicine

## 2012-03-21 DIAGNOSIS — J449 Chronic obstructive pulmonary disease, unspecified: Secondary | ICD-10-CM

## 2012-03-21 DIAGNOSIS — I10 Essential (primary) hypertension: Secondary | ICD-10-CM | POA: Insufficient documentation

## 2012-03-21 DIAGNOSIS — I2510 Atherosclerotic heart disease of native coronary artery without angina pectoris: Secondary | ICD-10-CM | POA: Insufficient documentation

## 2012-03-21 DIAGNOSIS — J4489 Other specified chronic obstructive pulmonary disease: Secondary | ICD-10-CM | POA: Insufficient documentation

## 2012-03-21 DIAGNOSIS — F17200 Nicotine dependence, unspecified, uncomplicated: Secondary | ICD-10-CM | POA: Insufficient documentation

## 2012-03-21 DIAGNOSIS — M545 Low back pain, unspecified: Secondary | ICD-10-CM | POA: Insufficient documentation

## 2012-03-21 DIAGNOSIS — M51379 Other intervertebral disc degeneration, lumbosacral region without mention of lumbar back pain or lower extremity pain: Secondary | ICD-10-CM | POA: Insufficient documentation

## 2012-03-21 DIAGNOSIS — M5137 Other intervertebral disc degeneration, lumbosacral region: Secondary | ICD-10-CM | POA: Insufficient documentation

## 2013-03-26 ENCOUNTER — Other Ambulatory Visit (HOSPITAL_COMMUNITY): Payer: Self-pay | Admitting: Internal Medicine

## 2013-03-26 ENCOUNTER — Ambulatory Visit (HOSPITAL_COMMUNITY)
Admission: RE | Admit: 2013-03-26 | Discharge: 2013-03-26 | Disposition: A | Discharge status: Home / Self Care | Payer: Medicare Other | Source: Ambulatory Visit | Attending: Internal Medicine | Admitting: Internal Medicine

## 2013-03-26 DIAGNOSIS — Z87891 Personal history of nicotine dependence: Secondary | ICD-10-CM | POA: Insufficient documentation

## 2013-03-26 DIAGNOSIS — I2510 Atherosclerotic heart disease of native coronary artery without angina pectoris: Secondary | ICD-10-CM | POA: Insufficient documentation

## 2013-03-26 DIAGNOSIS — M47814 Spondylosis without myelopathy or radiculopathy, thoracic region: Secondary | ICD-10-CM | POA: Insufficient documentation

## 2013-03-26 DIAGNOSIS — Z950 Presence of cardiac pacemaker: Secondary | ICD-10-CM | POA: Insufficient documentation

## 2013-03-26 DIAGNOSIS — I10 Essential (primary) hypertension: Secondary | ICD-10-CM | POA: Insufficient documentation

## 2013-06-27 IMAGING — CT CT HUMERUS*R* W/O CM
3 series · 17 of 35 positions shown, 19 images · non-contrast
Comparison: None.

CLINICAL DATA: Humeral head mass.  Humeral pain.  History of
surgery 7076.

CT OF THE RIGHT HUMERUS WITHOUT CONTRAST
TECHNIQUE: Multidetector CT imaging was performed according to the
standard protocol. Multiplanar CT image reconstructions were also
generated.

[Series 3: shoulder/standard · axial · 0.36mm/px · z∈[-172,+115]mm · 8 of 137 slices shown, 10 images]
[im 11/137  soft-tissue]
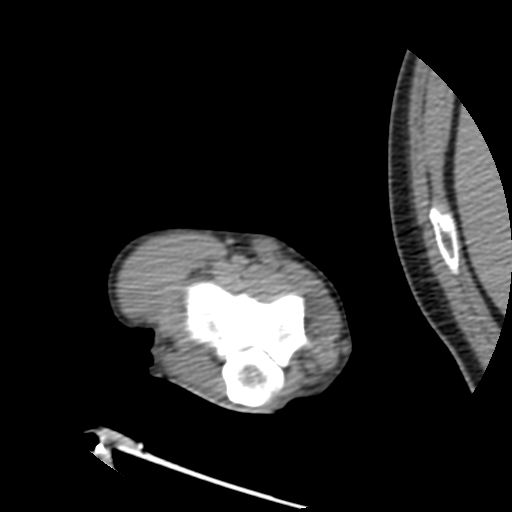
[im 11/137  bone]
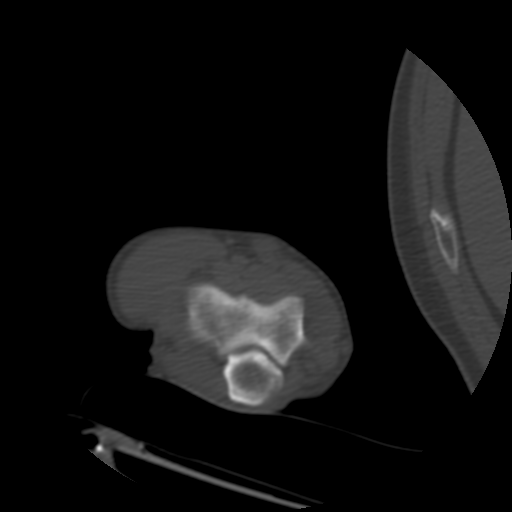
[im 32/137  bone]
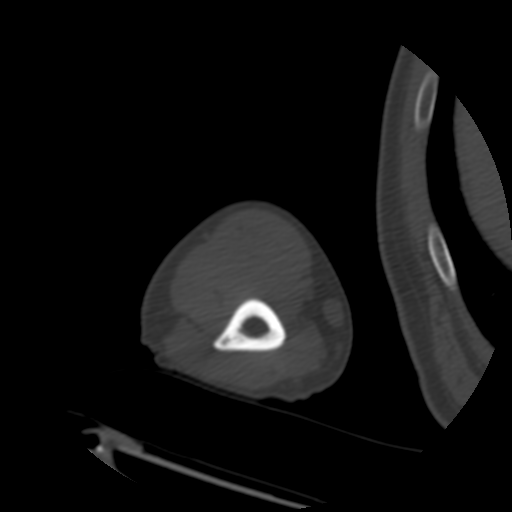
[im 42/137  bone]
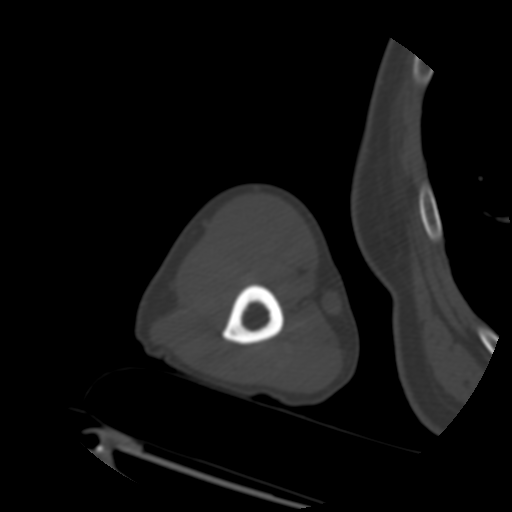
[im 63/137  bone]
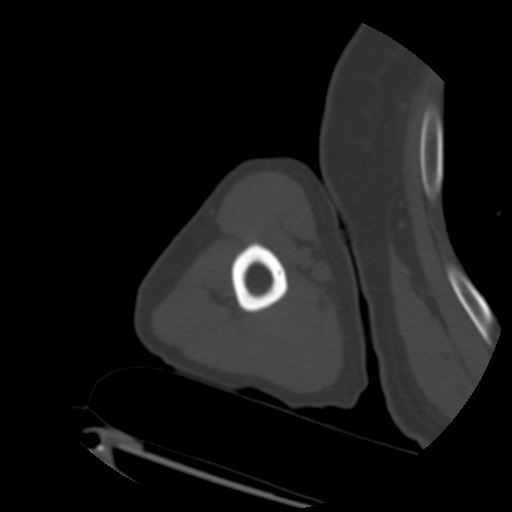
[im 74/137  soft-tissue]
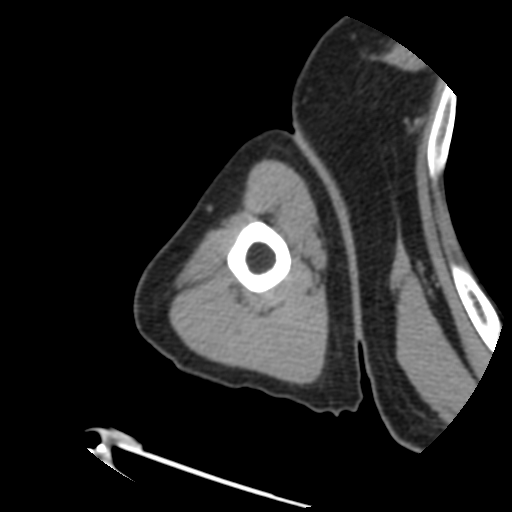
[im 74/137  bone]
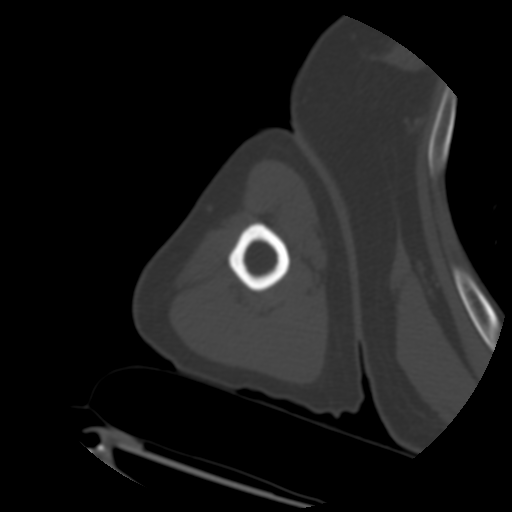
[im 95/137  bone]
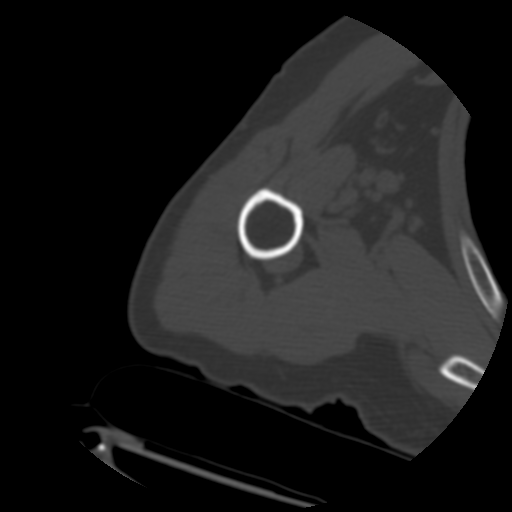
[im 105/137  bone]
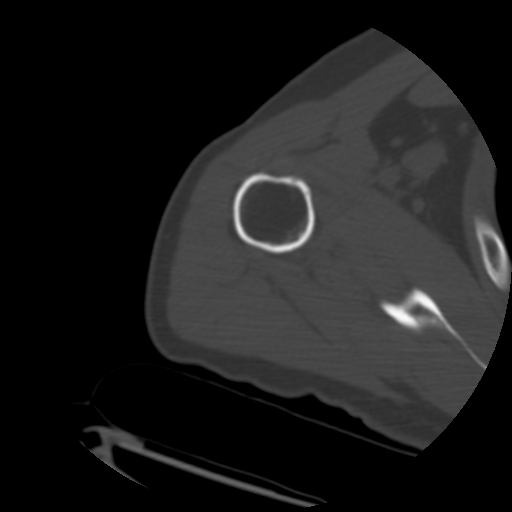
[im 126/137  bone]
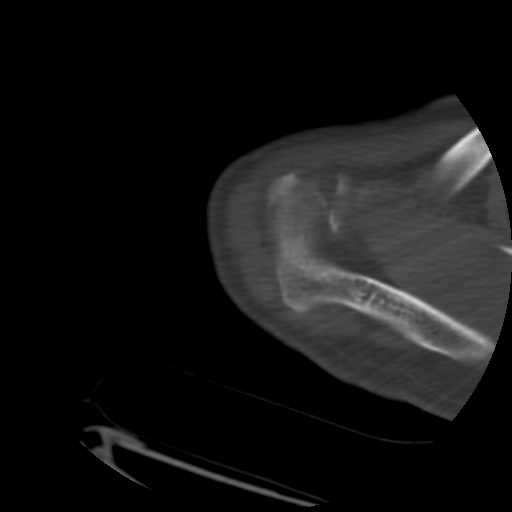

[Series 400: cor · coronal · 0.68mm/px · 3 of 64 slices shown]
[im 13/64  bone]
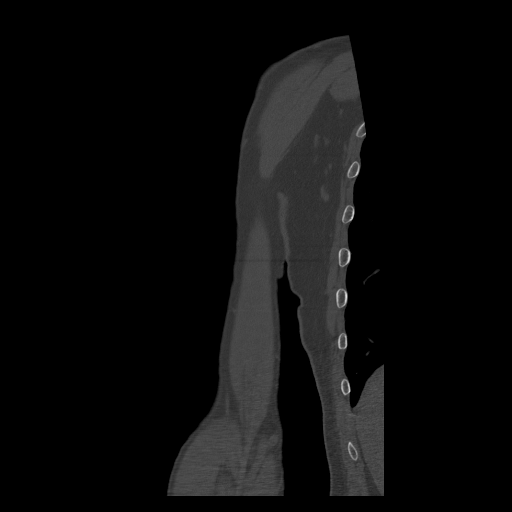
[im 26/64  bone]
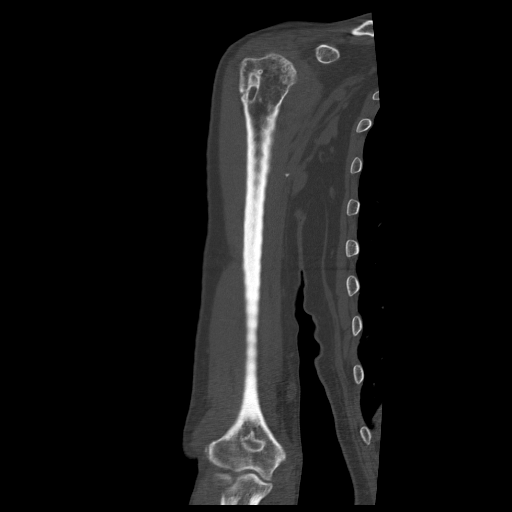
[im 38/64  bone]
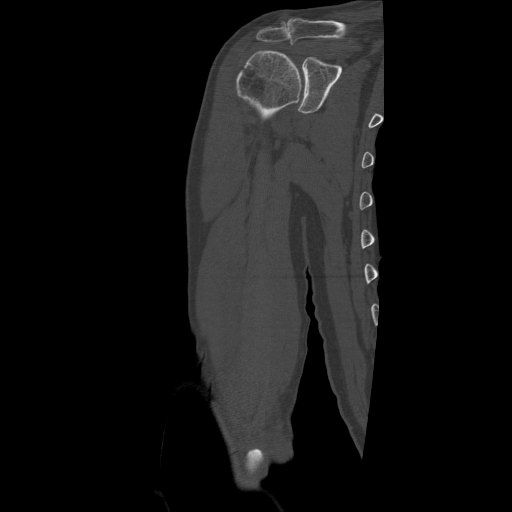

[Series 401: sag · sagittal · 0.68mm/px · 6 of 55 slices shown]
[im 19/55  bone]
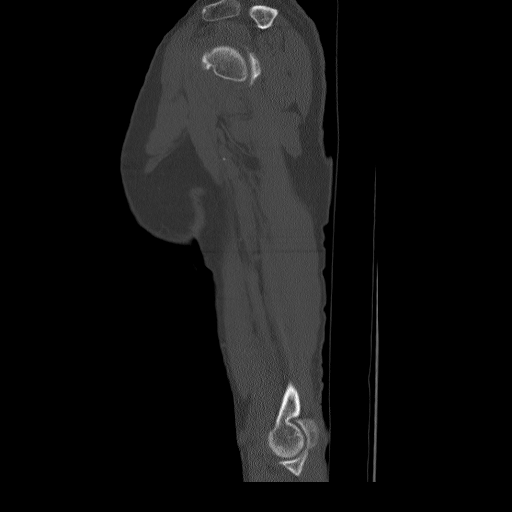
[im 23/55  bone]
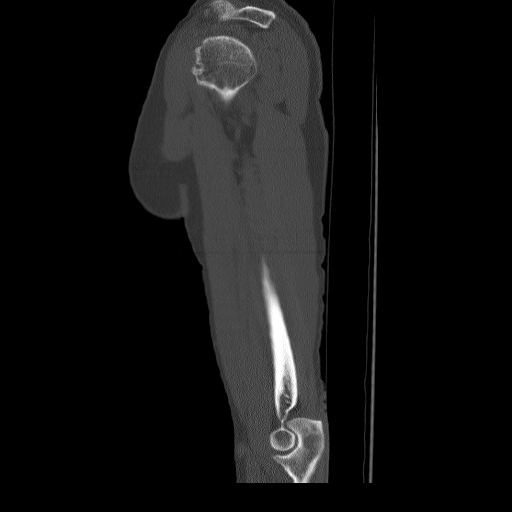
[im 28/55  bone]
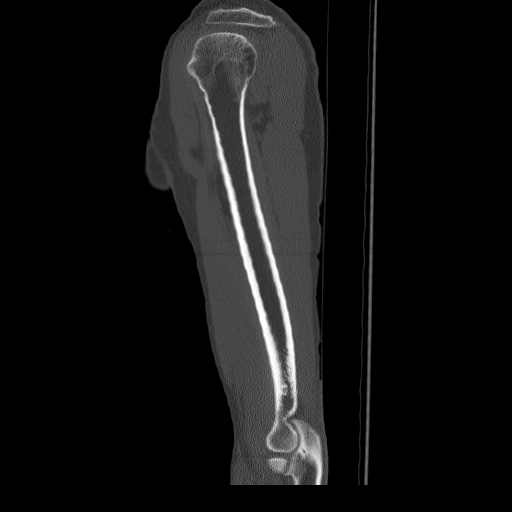
[im 32/55  bone]
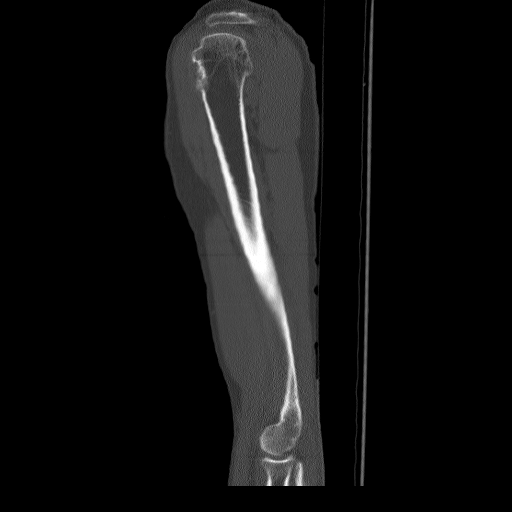
[im 37/55  bone]
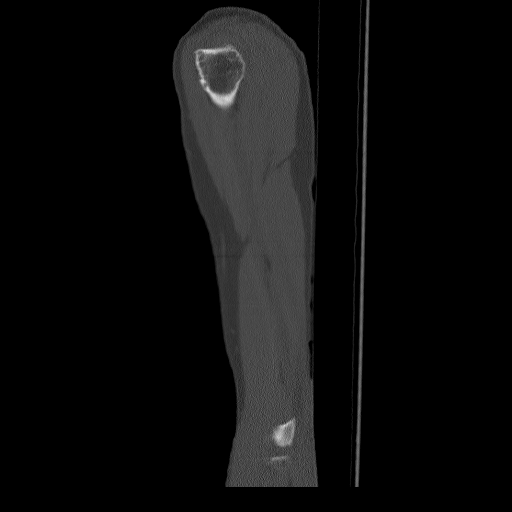
[im 43/55  soft-tissue]
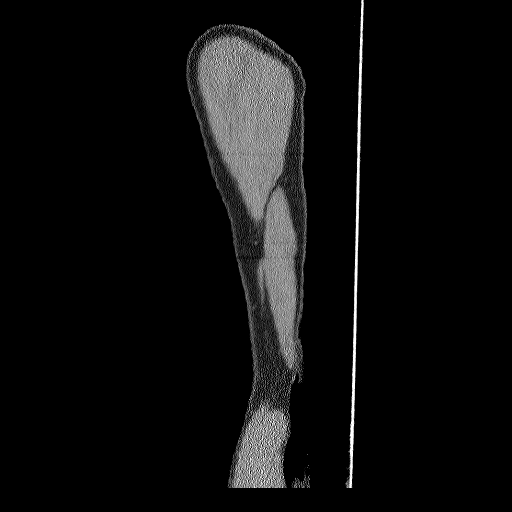

[17 of 35 positions shown; findings below may reference images not displayed]

FINDINGS: There is cortical irregularity the anterior humeral head
adjacent to the bicipital groove.  This is well corticated, with an
appearance that suggests chronicity, possibly postoperative in this
patient with history of prior shoulder operation.  No destructive
osseous lesion is identified.  Normal fatty marrow is present in
the proximal humerus.  There is no disruption of trabeculae.

The AC joint is degenerated, with distal clavicle undersurface
spurring and calcification of the cartilage disc.  Mild
glenohumeral osteoarthritis is present.  Remainder of the humerus
appears within normal limits.  Partial visualization of the right
chest is unremarkable.  Biceps and triceps muscles appear within
normal limits.  Brachialis appears unremarkable.  No glenohumeral
effusion is identified.  No rotator cuff muscular atrophy.
IMPRESSION: 1.  No humeral head mass or destructive osseous lesion identified.
2.  Irregularity of the anterior humeral head appears chronic,
probably postoperative and possibly associated with either prior
subscapularis tendon repair or biceps long head intervention.
3.  Mild glenohumeral osteoarthritis.
4.  AC joint osteoarthritis with undersurface spurring.

## 2013-07-25 ENCOUNTER — Encounter: Payer: Self-pay | Admitting: Internal Medicine

## 2013-07-25 DIAGNOSIS — E559 Vitamin D deficiency, unspecified: Secondary | ICD-10-CM | POA: Insufficient documentation

## 2013-07-25 DIAGNOSIS — I10 Essential (primary) hypertension: Secondary | ICD-10-CM | POA: Insufficient documentation

## 2013-07-25 DIAGNOSIS — M1990 Unspecified osteoarthritis, unspecified site: Secondary | ICD-10-CM | POA: Insufficient documentation

## 2013-07-25 DIAGNOSIS — E1122 Type 2 diabetes mellitus with diabetic chronic kidney disease: Secondary | ICD-10-CM | POA: Insufficient documentation

## 2013-07-25 DIAGNOSIS — E785 Hyperlipidemia, unspecified: Secondary | ICD-10-CM | POA: Insufficient documentation

## 2013-07-25 DIAGNOSIS — J449 Chronic obstructive pulmonary disease, unspecified: Secondary | ICD-10-CM | POA: Insufficient documentation

## 2013-07-29 ENCOUNTER — Ambulatory Visit (INDEPENDENT_AMBULATORY_CARE_PROVIDER_SITE_OTHER): Payer: Medicare Other | Admitting: Physician Assistant

## 2013-07-29 ENCOUNTER — Encounter: Payer: Self-pay | Admitting: Physician Assistant

## 2013-07-29 VITALS — BP 128/62 | HR 80 | Temp 97.7°F | Resp 16 | Wt 128.0 lb

## 2013-07-29 DIAGNOSIS — E538 Deficiency of other specified B group vitamins: Secondary | ICD-10-CM

## 2013-07-29 DIAGNOSIS — I10 Essential (primary) hypertension: Secondary | ICD-10-CM

## 2013-07-29 LAB — BASIC METABOLIC PANEL WITH GFR
Calcium: 9.6 mg/dL (ref 8.4–10.5)
GFR, Est African American: >89 mL/min
GFR, Est Non African American: 86 mL/min
Potassium: 4.8 mEq/L (ref 3.5–5.3)
Sodium: 134 mEq/L — ABNORMAL LOW (ref 135–145)

## 2013-07-29 LAB — VITAMIN B12: Vitamin B-12: 361 pg/mL (ref 211–911)

## 2013-07-29 NOTE — Progress Notes (Signed)
HPI Patient presents for a one month follow up. 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 Sugar at home has been running 112 or so at home. BP is at goal and patient denies any CP, SOB or AE's from the switch. Patient also had a low B12 at 339 and it was added.   Past Medical History  Diagnosis Date  . CAD (coronary artery disease)   . Polycythemia   . Bilirubinemia   . PAD (peripheral artery disease)   . Hernia   . Diverticulitis   . Type II or unspecified type diabetes mellitus without mention of complication, not stated as uncontrolled   . Arthritis   . COPD (chronic obstructive pulmonary disease)   . Hypertension   . Hyperlipidemia   . Vitamin D deficiency      Allergies  Allergen Reactions  . Ace Inhibitors Cough  . Sulfonamide Derivatives     REACTION: upset stomach  . Zoloft [Sertraline Hcl] Diarrhea      Current Outpatient Prescriptions on File Prior to Visit  Medication Sig Dispense Refill  . aspirin 81 MG tablet Take 81 mg by mouth daily.        Marland Kitchen atenolol (TENORMIN) 100 MG tablet Take 100 mg by mouth daily.        . Cholecalciferol (VITAMIN D PO) Take 2,500 Units by mouth 1 day or 1 dose.        Marland Kitchen doxazosin (CARDURA) 8 MG tablet Take 8 mg by mouth at bedtime.        Marland Kitchen glyBURIDE (DIABETA) 5 MG tablet Take 5 mg by mouth 3 (three) times daily.        . pravastatin (PRAVACHOL) 40 MG tablet Take 40 mg by mouth daily.        . verapamil (CALAN) 120 MG tablet Take 120 mg by mouth 3 (three) times daily.         No current facility-administered medications on file prior to visit.    ROS: all negative expect above.   Physical: Filed Weights   07/29/13 1031  Weight: 128 lb (58.06 kg)   Filed Vitals:   07/29/13 1031  BP: 128/62  Pulse: 80  Temp: 97.7 F (36.5 C)  Resp: 16   General Appearance: Well nourished, in no apparent distress. Eyes: PERRLA, EOMs. Sinuses: No  Frontal/maxillary tenderness ENT/Mouth: Ext aud canals clear, normal light reflex with TMs without erythema, bulging. Post pharynx without erythema, swelling, exudate.  Respiratory: CTAB Cardio: RRR, no murmurs, rubs or gallops. Peripheral pulses brisk and equal bilaterally, without edema. No aortic or femoral bruits. Abdomen: Flat, soft, with bowl sounds. Nontender, no guarding, rebound. Lymphatics: Non tender without lymphadenopathy.  Musculoskeletal: Full ROM all peripheral extremities, 5/5 strength, and normal gait. Skin: Warm, dry without rashes, lesions, ecchymosis.  Neuro: Cranial nerves intact, reflexes equal bilaterally. Normal muscle tone, no cerebellar symptoms. Sensation intact.  Pysch: Awake and oriented X 3, normal affect, Insight and Judgment appropriate.   Assessment and Plan: 1. Hypertension Cont cozaar and monitor at home Call if any CP, SOB - BASIC METABOLIC PANEL WITH GFR  2. B12 deficiency Cont B12 pill and check level may need to switch to sublingual.  - Vitamin B12

## 2013-07-29 NOTE — Patient Instructions (Signed)

## 2013-09-12 ENCOUNTER — Other Ambulatory Visit: Payer: Self-pay | Admitting: Internal Medicine

## 2013-09-17 ENCOUNTER — Other Ambulatory Visit: Payer: Self-pay | Admitting: Internal Medicine

## 2013-09-24 ENCOUNTER — Ambulatory Visit (INDEPENDENT_AMBULATORY_CARE_PROVIDER_SITE_OTHER): Payer: Medicare Other | Admitting: Internal Medicine

## 2013-09-24 ENCOUNTER — Encounter: Payer: Self-pay | Admitting: Internal Medicine

## 2013-09-24 VITALS — BP 126/64 | HR 84 | Temp 97.9°F | Resp 16 | Wt 128.6 lb

## 2013-09-24 DIAGNOSIS — E559 Vitamin D deficiency, unspecified: Secondary | ICD-10-CM

## 2013-09-24 DIAGNOSIS — E785 Hyperlipidemia, unspecified: Secondary | ICD-10-CM

## 2013-09-24 DIAGNOSIS — I2510 Atherosclerotic heart disease of native coronary artery without angina pectoris: Secondary | ICD-10-CM | POA: Insufficient documentation

## 2013-09-24 DIAGNOSIS — I10 Essential (primary) hypertension: Secondary | ICD-10-CM

## 2013-09-24 DIAGNOSIS — E119 Type 2 diabetes mellitus without complications: Secondary | ICD-10-CM

## 2013-09-24 DIAGNOSIS — Z79899 Other long term (current) drug therapy: Secondary | ICD-10-CM

## 2013-09-24 LAB — HEPATIC FUNCTION PANEL
AST: 11 U/L (ref 0–37)
Albumin: 4.3 g/dL (ref 3.5–5.2)
Alkaline Phosphatase: 57 U/L (ref 39–117)
BILIRUBIN INDIRECT: 0.3 mg/dL (ref 0.0–0.9)
Bilirubin, Direct: 0.1 mg/dL (ref 0.0–0.3)
TOTAL PROTEIN: 6.8 g/dL (ref 6.0–8.3)
Total Bilirubin: 0.4 mg/dL (ref 0.3–1.2)

## 2013-09-24 LAB — CBC WITH DIFFERENTIAL/PLATELET
BASOS PCT: 0 % (ref 0–1)
Basophils Absolute: 0 10*3/uL (ref 0.0–0.1)
EOS ABS: 0.1 10*3/uL (ref 0.0–0.7)
Eosinophils Relative: 1 % (ref 0–5)
HEMATOCRIT: 47 % (ref 39.0–52.0)
HEMOGLOBIN: 15.9 g/dL (ref 13.0–17.0)
Lymphocytes Relative: 22 % (ref 12–46)
Lymphs Abs: 1.5 10*3/uL (ref 0.7–4.0)
MCH: 29.8 pg (ref 26.0–34.0)
MCHC: 33.8 g/dL (ref 30.0–36.0)
MCV: 88 fL (ref 78.0–100.0)
MONO ABS: 0.3 10*3/uL (ref 0.1–1.0)
MONOS PCT: 4 % (ref 3–12)
Neutro Abs: 5.2 10*3/uL (ref 1.7–7.7)
Neutrophils Relative %: 73 % (ref 43–77)
Platelets: 217 10*3/uL (ref 150–400)
RBC: 5.34 MIL/uL (ref 4.22–5.81)
RDW: 13.9 % (ref 11.5–15.5)
WBC: 7.1 10*3/uL (ref 4.0–10.5)

## 2013-09-24 LAB — MAGNESIUM: Magnesium: 2 mg/dL (ref 1.5–2.5)

## 2013-09-24 LAB — TSH: TSH: 2.858 u[IU]/mL (ref 0.350–4.500)

## 2013-09-24 LAB — BASIC METABOLIC PANEL WITH GFR
BUN: 14 mg/dL (ref 6–23)
CALCIUM: 9.7 mg/dL (ref 8.4–10.5)
CO2: 27 meq/L (ref 19–32)
Chloride: 99 mEq/L (ref 96–112)
Creat: 0.95 mg/dL (ref 0.50–1.35)
GFR, Est Non African American: 79 mL/min
GLUCOSE: 192 mg/dL — AB (ref 70–99)
POTASSIUM: 4.7 meq/L (ref 3.5–5.3)
Sodium: 133 mEq/L — ABNORMAL LOW (ref 135–145)

## 2013-09-24 LAB — LIPID PANEL
CHOLESTEROL: 141 mg/dL (ref 0–200)
HDL: 41 mg/dL (ref >39)
LDL Cholesterol: 60 mg/dL (ref 0–99)
Total CHOL/HDL Ratio: 3.4 Ratio
Triglycerides: 199 mg/dL — ABNORMAL HIGH (ref <150)
VLDL: 40 mg/dL (ref 0–40)

## 2013-09-24 LAB — HEMOGLOBIN A1C
HEMOGLOBIN A1C: 7.4 % — AB (ref <5.7)
Mean Plasma Glucose: 166 mg/dL — ABNORMAL HIGH (ref <117)

## 2013-09-24 NOTE — Progress Notes (Signed)
Patient ID: Tim Lyons, male   DOB: 1941/01/05, 73 y.o.   MRN: [id number]   This very nice 73 y.o. Tim Lyons presents for 3 month follow up with Hypertension, ASHD/CABG, Hyperlipidemia, T2 NIDDM  and Vitamin D Deficiency.    HTN predates since 22. BP has been controlled at home. Today's BP: 126/64 mmHg.  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 He has done well since. Patient denies any cardiac type chest pain, palpitations, dyspnea/orthopnea/PND, dizziness, claudication, or dependent edema.   Hyperlipidemia is controlled with diet & meds. Last Cholesterol was 162, Triglycerides were 249, HDL 45 and LDL 667 in Oct 2014 - at goal. Patient denies myalgias or other med SE's.    Also, the patient has history of T2 Diabetes since 2000 with last A1c of 7.5% in Oct 2014 & labs found BUN/Creat of 11/0.95  And calc GFR of 79 in mild CRI range. Last Patient denies any symptoms of reactive hypoglycemia, diabetic polys, paresthesias or visual blurring.   Further, Patient has history of Vitamin D Deficiency  Of 27 in 2008 with last vitamin D of 82 in Oct 2014. Patient supplements vitamin D without any suspected side-effects.    Medication List         aspirin 81 MG tablet  Take 81 mg by mouth daily.     atenolol 100 MG tablet  Commonly known as:  TENORMIN  Take 1 tablet by mouth daily for blood pressure and heart.     doxazosin 8 MG tablet  Commonly known as:  CARDURA  Take 1 tablet by mouth nightly at bedtime for blood pressure.     glyBURIDE 5 MG tablet  Commonly known as:  DIABETA  Take 5 mg by mouth 3 (three) times daily.     metFORMIN 500 MG 24 hr tablet  Commonly known as:  GLUCOPHAGE-XR     pravastatin 40 MG tablet  Commonly known as:  PRAVACHOL  Take 40 mg by mouth daily.     verapamil 120 MG tablet  Commonly known as:  CALAN  Take 120 mg by mouth 3 (three) times daily.     VITAMIN D PO  Take 2,500  Units by mouth 1 day or 1 dose.         Allergies  Allergen Reactions  . Ace Inhibitors Cough  . Sulfonamide Derivatives     REACTION: upset stomach  . Zoloft [Sertraline Hcl] Diarrhea    PMHx:   Past Medical History  Diagnosis Date  . CAD (coronary artery disease)   . Polycythemia   . Bilirubinemia   . PAD (peripheral artery disease)   . Hernia   . Diverticulitis   . Type II or unspecified type diabetes mellitus without mention of complication, not stated as uncontrolled   . Arthritis   . COPD (chronic obstructive pulmonary disease)   . Hypertension   . Hyperlipidemia   . Vitamin D deficiency     FHx:    Reviewed / unchanged  SHx:    Reviewed / unchanged  Systems Review: Constitutional: Denies fever, chills, wt changes, headaches, insomnia, fatigue, night sweats, change in appetite. Eyes: Denies redness, blurred vision, diplopia, discharge, itchy, watery eyes.  ENT: Denies discharge, congestion, post nasal drip, epistaxis, sore throat, earache, hearing loss, dental pain, tinnitus, vertigo, sinus pain, snoring.  CV: Denies chest pain, palpitations, irregular heartbeat, syncope, dyspnea, diaphoresis, orthopnea, PND,  claudication, edema. Respiratory: denies cough, dyspnea, DOE, pleurisy, hoarseness, laryngitis, wheezing.  Gastrointestinal: Denies dysphagia, odynophagia, heartburn, reflux, water brash, abdominal pain or cramps, nausea, vomiting, bloating, diarrhea, constipation, hematemesis, melena, hematochezia,  or hemorrhoids. Genitourinary: Denies dysuria, frequency, urgency, nocturia, hesitancy, discharge, hematuria, flank pain. Musculoskeletal: Denies arthralgias, myalgias, stiffness, jt. swelling, pain, limp, strain/sprain.  Skin: Denies pruritus, rash, hives, warts, acne, eczema, change in skin lesion(s). Neuro: No weakness, tremor, incoordination, spasms, paresthesia, or pain. Psychiatric: Denies confusion, memory loss, or sensory loss. Endo: Denies change in  weight, skin, hair change.  Heme/Lymph: No excessive bleeding, bruising, orenlarged lymph nodes.  BP: 126/64  Pulse: 84  Temp: 97.9 F (36.6 C)  Resp: 16    Estimated body mass index is 22.06 kg/(m^2) as calculated from the following:   Height as of 05/26/11: 5\' 4"  (1.626 m).   Weight as of this encounter: 128 lb 9.6 oz (58.333 kg).  On Exam: Appears well nourished - in no distress. Eyes: PERRLA, EOMs, conjunctiva no swelling or erythema. Sinuses: No frontal/maxillary tenderness ENT/Mouth: EAC's clear, TM's nl w/o erythema, bulging. Nares clear w/o erythema, swelling, exudates. Oropharynx clear without erythema or exudates. Oral hygiene is good. Tongue normal, non obstructing. Hearing intact.  Neck: Supple. Thyroid nl. Car 2+/2+ without bruits, nodes or JVD. Chest: Respirations nl with BS clear & equal w/o rales, rhonchi, wheezing or stridor.  Cor: Heart sounds normal w/ regular rate and rhythm without sig. murmurs, gallops, clicks, or rubs. Peripheral pulses normal and equal  without edema.  Abdomen: Soft & bowel sounds normal. Non-tender w/o guarding, rebound, hernias, masses, or organomegaly.  Lymphatics: Unremarkable.  Musculoskeletal: Full ROM all peripheral extremities, joint stability, 5/5 strength, and normal gait.  Skin: Warm, dry without exposed rashes, lesions, ecchymosis apparent.  Neuro: Cranial nerves intact, reflexes equal bilaterally. Sensory-motor testing grossly intact. Tendon reflexes grossly intact.  Pysch: Alert & oriented x 3. Insight and judgement nl & appropriate. No ideations.  Assessment and Plan:  1. Hypertension - Continue monitor blood pressure at home. Continue diet/meds same.  2. Hyperlipidemia - Continue diet/meds, exercise,& lifestyle modifications. Continue monitor periodic cholesterol/liver & renal functions   3. T2 NIDDM w/CRI (stage I) - continue recommend prudent low glycemic diet, weight control, regular exercise, diabetic monitoring and  periodic eye exams.  4. Vitamin D Deficiency - Continue supplementation.  5. ASHD/CABG  6.COPD - discouraged smoking  Recommended regular exercise, BP monitoring, weight control, and discussed med and SE's. Recommended labs to assess and monitor clinical status. Further disposition pending results of labs.

## 2013-09-24 NOTE — Patient Instructions (Signed)

## 2013-09-25 LAB — INSULIN, FASTING: INSULIN FASTING, SERUM: 26 u[IU]/mL (ref 3–28)

## 2013-09-25 LAB — VITAMIN D 25 HYDROXY (VIT D DEFICIENCY, FRACTURES): Vit D, 25-Hydroxy: 66 ng/mL (ref 30–89)

## 2013-12-11 ENCOUNTER — Other Ambulatory Visit: Payer: Self-pay | Admitting: Physician Assistant

## 2013-12-20 ENCOUNTER — Other Ambulatory Visit: Payer: Self-pay | Admitting: Internal Medicine

## 2014-01-22 ENCOUNTER — Ambulatory Visit (INDEPENDENT_AMBULATORY_CARE_PROVIDER_SITE_OTHER): Payer: Medicare Other | Admitting: Physician Assistant

## 2014-01-22 ENCOUNTER — Encounter: Payer: Self-pay | Admitting: Physician Assistant

## 2014-01-22 VITALS — BP 120/62 | HR 72 | Temp 98.2°F | Resp 16 | Ht 63.0 in | Wt 126.0 lb

## 2014-01-22 DIAGNOSIS — E785 Hyperlipidemia, unspecified: Secondary | ICD-10-CM

## 2014-01-22 DIAGNOSIS — E559 Vitamin D deficiency, unspecified: Secondary | ICD-10-CM

## 2014-01-22 DIAGNOSIS — Z0000 Encounter for general adult medical examination without abnormal findings: Secondary | ICD-10-CM

## 2014-01-22 DIAGNOSIS — Z79899 Other long term (current) drug therapy: Secondary | ICD-10-CM

## 2014-01-22 DIAGNOSIS — I10 Essential (primary) hypertension: Secondary | ICD-10-CM

## 2014-01-22 DIAGNOSIS — E119 Type 2 diabetes mellitus without complications: Secondary | ICD-10-CM

## 2014-01-22 LAB — HEMOGLOBIN A1C
Hgb A1c MFr Bld: 7.3 % — ABNORMAL HIGH (ref <5.7)
Mean Plasma Glucose: 163 mg/dL — ABNORMAL HIGH (ref <117)

## 2014-01-22 LAB — CBC WITH DIFFERENTIAL/PLATELET
BASOS ABS: 0.1 10*3/uL (ref 0.0–0.1)
BASOS PCT: 1 % (ref 0–1)
EOS ABS: 0.1 10*3/uL (ref 0.0–0.7)
EOS PCT: 1 % (ref 0–5)
HEMATOCRIT: 44.3 % (ref 39.0–52.0)
Hemoglobin: 15.7 g/dL (ref 13.0–17.0)
LYMPHS PCT: 18 % (ref 12–46)
Lymphs Abs: 1.5 10*3/uL (ref 0.7–4.0)
MCH: 30.7 pg (ref 26.0–34.0)
MCHC: 35.4 g/dL (ref 30.0–36.0)
MCV: 86.5 fL (ref 78.0–100.0)
MONO ABS: 0.3 10*3/uL (ref 0.1–1.0)
Monocytes Relative: 4 % (ref 3–12)
Neutro Abs: 6.2 10*3/uL (ref 1.7–7.7)
Neutrophils Relative %: 76 % (ref 43–77)
PLATELETS: 209 10*3/uL (ref 150–400)
RBC: 5.12 MIL/uL (ref 4.22–5.81)
RDW: 14.3 % (ref 11.5–15.5)
WBC: 8.1 10*3/uL (ref 4.0–10.5)

## 2014-01-22 NOTE — Progress Notes (Signed)
Assessment:   1. Hypertension - CBC with Differential - BASIC METABOLIC PANEL WITH GFR - Hepatic function panel - TSH  2. Type II or unspecified type diabetes mellitus without mention of complication, not stated as uncontrolled Discussed general issues about diabetes pathophysiology and management., Educational material distributed., Suggested low cholesterol diet., Encouraged aerobic exercise., Discussed foot care., Reminded to get yearly retinal exam. - Hemoglobin A1c - Insulin, fasting  3. Hyperlipidemia - Lipid panel  4. Vitamin D deficiency - Vit D  25 hydroxy (rtn osteoporosis monitoring)  5. Encounter for long-term (current) use of other medications - Magnesium   Plan:   During the course of the visit the patient was educated and counseled about appropriate screening and preventive services including:    Pneumococcal vaccine   Influenza vaccine  Td vaccine  Screening electrocardiogram  Colorectal cancer screening  Diabetes screening  Glaucoma screening  Nutrition counseling   Smoking cessation counseling  Advanced directives: given information  Screening recommendations, referrals: Vaccinations: Tdap vaccine not indicated Influenza vaccine not indicated Pneumococcal vaccine not indicated Shingles vaccine declined Hep B vaccine not indicated  Nutrition assessed and recommended  Colonoscopy due 2019 Recommended yearly ophthalmology/optometry visit for glaucoma screening and checkup Recommended yearly dental visit for hygiene and checkup Advanced directives - requested  Conditions/risks identified: BMI: Discussed weight loss, diet, and increase physical activity.  Increase physical activity: AHA recommends 150 minutes of physical activity a week.  Medications reviewed Diabetes is not at goal, ACE/ARB therapy: Yes. Urinary Incontinence is not an issue: discussed non pharmacology and pharmacology options.  Fall risk: high- discussed PT, home fall  assessment, medications.   Subjective:  Tim Lyons is a 73 y.o. male who presents for Medicare Annual Wellness Visit and 3 month follow up for HTN, hyperlipidemia, diabetes, and vitamin D Def.  Date of last medicare wellness visit was is unknown.  His blood pressure has been controlled at home, today their BP is BP: 120/62 mmHg He does workout, mows grass weekly, decreased exercise due to back pain. He denies chest pain, shortness of breath, dizziness.  He is on cholesterol medication and denies myalgias. His cholesterol is at goal. The cholesterol last visit was:   Lab Results  Component Value Date   CHOL 141 09/24/2013   HDL 41 09/24/2013   LDLCALC 60 09/24/2013   TRIG 199* 09/24/2013   CHOLHDL 3.4 09/24/2013   He has been working on diet and exercise for diabetes, and denies nausea, polydipsia and polyuria. He has paresthesias. Last A1C in the office was:  Lab Results  Component Value Date   HGBA1C 7.4* 09/24/2013   Patient is on Vitamin D supplement.     Names of Other Physician/Practitioners you currently use: 1. [facility] here for primary care 2. Dr. Herbert Deaner, eye doctor, last visit 3 months ago 3. None dentist, last visit unknown Patient Care Team: Unk Pinto, MD as PCP - General (Internal Medicine)  Medication Review: Current Outpatient Prescriptions on File Prior to Visit  Medication Sig Dispense Refill  . aspirin 81 MG tablet Take 81 mg by mouth daily.        Marland Kitchen atenolol (TENORMIN) 100 MG tablet TAKE ONE TABLET BY MOUTH EVERY DAY FOR BLOOD PRESSURE and heart  90 tablet  0  . Cholecalciferol (VITAMIN D PO) Take 2,500 Units by mouth 1 day or 1 dose.        Marland Kitchen doxazosin (CARDURA) 8 MG tablet Take 1 tablet by mouth nightly at  bedtime for blood pressure.  90 tablet  1  . glyBURIDE (DIABETA) 5 MG tablet Take 5 mg by mouth 3 (three) times daily.        . metFORMIN (GLUCOPHAGE-XR) 500 MG 24 hr tablet Take 1 tablet by mouth twice  daily at breakfast & lunch and 2 tablets at supper time.  360 tablet  2  . pravastatin (PRAVACHOL) 40 MG tablet Take 40 mg by mouth daily.        . verapamil (CALAN) 120 MG tablet Take 120 mg by mouth 3 (three) times daily.         No current facility-administered medications on file prior to visit.    Current Problems (verified) Patient Active Problem List   Diagnosis Date Noted  . 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 Type II or unspecified type diabetes mellitus without mention of complication, not stated as uncontrolled   . Arthritis   . COPD (chronic obstructive pulmonary disease)   . Hypertension   . Hyperlipidemia   . Vitamin D deficiency   . Malignant melanoma of trunk 05/26/2011    Screening Tests Health Maintenance  Topic Date Due  . Zostavax  09/02/1999  . Influenza Vaccine  03/29/2014  . Tetanus/tdap  08/30/2015  . Colonoscopy  01/27/2018  . Pneumococcal Polysaccharide Vaccine Age 4 And Over  Completed    Immunization History  Administered Date(s) Administered  . Influenza Split 06/25/2013  . Pneumococcal Polysaccharide-23 08/29/2005  . Td 08/29/2005    Preventative care: Last colonoscopy: 2009 due 2019  Prior vaccinations: TD or Tdap: 2007 Influenza: 2014  Pneumococcal: 2007 Shingles/Zostavax: declines  History reviewed: allergies, current medications, past family history, past medical history, past social history, past surgical history and problem list   Risk Factors: Tobacco History  Substance Use Topics  . Smoking status: Current Some Day Smoker -- 0.50 packs/day  . Smokeless tobacco: Not on file     Comment: trying to quit  . Alcohol Use: Yes     Comment: 2 beers but rarely   He does smoke but has cut back considerably.   Are there smokers in your home (other than you)?  No  Alcohol Current alcohol use: social drinker  Caffeine Current caffeine use: coffee 2-4 /day  Exercise Current exercise: no regular exercise  Nutrition/Diet Current diet:  in general, a "healthy" diet    Cardiac risk factors: advanced age (older than 14 for men, 31 for women), diabetes mellitus, dyslipidemia, family history of premature cardiovascular disease, hypertension, male gender, sedentary lifestyle and smoking/ tobacco exposure.  Depression Screen (Note: if answer to either of the following is "Yes", a more complete depression screening is indicated)   Q1: Over the past two weeks, have you felt down, depressed or hopeless? No  Q2: Over the past two weeks, have you felt little interest or pleasure in doing things? No  Have you lost interest or pleasure in daily life? No  Do you often feel hopeless? No  Do you cry easily over simple problems? No  Activities of Daily Living In your present state of health, do you have any difficulty performing the following activities?:  Driving? No Managing money?  No Feeding yourself? No Getting from bed to chair? No Climbing a flight of stairs? Yes Preparing food and eating?: No Bathing or showering? No Getting dressed: No Getting to the toilet? No Using the toilet:No Moving around from place to place: No In the past year have you fallen or had a near fall?:Yes   Are  you sexually active?  No  Do you have more than one partner?  No  Vision Difficulties: No  Hearing Difficulties: No Do you often ask people to speak up or repeat themselves? No Do you experience ringing or noises in your ears? No Do you have difficulty understanding soft or whispered voices? No  Cognition  Do you feel that you have a problem with memory?No  Do you often misplace items? No  Do you feel safe at home?  Yes  Advanced directives Does patient have a [place]? No Does patient have a Living Will? No   Objective:   Blood pressure 120/62, pulse 72, temperature 98.2 F (36.8 C), resp. rate 16, height 5\' 3"  (1.6 m), weight 126 lb (57.153 kg). Body mass index is 22.33 kg/(m^2).  General appearance:  alert, no distress, WD/WN, male Cognitive Testing  Alert? Yes  Normal Appearance?Yes  Oriented to person? Yes  Place? Yes   Time? Yes  Recall of three objects?  Yes  Can perform simple calculations? Yes  Displays appropriate judgment?Yes  Can read the correct time from a watch face?Yes  HEENT: normocephalic, sclerae anicteric, TMs pearly, nares patent, no discharge or erythema, pharynx normal Oral cavity: MMM, no lesions Neck: supple, no lymphadenopathy, no thyromegaly, no masses Heart: RRR, normal S1, S2, no murmurs Lungs: CTA bilaterally, no wheezes, rhonchi, or rales Abdomen: +bs, soft, non tender, non distended, no masses, no hepatomegaly, no splenomegaly Musculoskeletal: nontender, no swelling, no obvious deformity Extremities: no edema, no cyanosis, no clubbing Pulses: 2+ symmetric, upper and lower extremities, normal cap refill Neurological: alert, oriented x 3, CN2-12 intact, strength normal upper extremities and lower extremities, sensation normal throughout, DTRs 2+ throughout, no cerebellar signs, gait normal Psychiatric: normal affect, behavior normal, pleasant    Medicare Attestation I have personally reviewed: The patient's medical and social history Their use of alcohol, tobacco or illicit drugs Their current medications and supplements The patient's functional ability including ADLs,fall risks, home safety risks, cognitive, and hearing and visual impairment Diet and physical activities Evidence for depression or mood disorders  The patient's weight, height, BMI, and visual acuity have been recorded in the chart.  I have made referrals, counseling, and provided education to the patient based on review of the above and I have provided the patient with a written personalized care plan for preventive services.     Vicie Mutters, PA-C   01/22/2014

## 2014-01-22 NOTE — Patient Instructions (Signed)
Preventative Care for Adults, Male       REGULAR HEALTH EXAMS:  A routine yearly physical is a good way to check in with your primary care provider about your health and preventive screening. It is also an opportunity to share updates about your health and any concerns you have, and receive a thorough all-over exam.   Most health insurance companies pay for at least some preventative services.  Check with your health plan for specific coverages.  WHAT PREVENTATIVE SERVICES DO MEN NEED?  Adult men should have their weight and blood pressure checked regularly.   Men age 35 and older should have their cholesterol levels checked regularly.  Beginning at age 50 and continuing to age 75, men should be screened for colorectal cancer.  Certain people should may need continued testing until age 85.  Other cancer screening may include exams for testicular and prostate cancer.  Updating vaccinations is part of preventative care.  Vaccinations help protect against diseases such as the flu.  Lab tests are generally done as part of preventative care to screen for anemia and blood disorders, to screen for problems with the kidneys and liver, to screen for bladder problems, to check blood sugar, and to check your cholesterol level.  Preventative services generally include counseling about diet, exercise, avoiding tobacco, drugs, excessive alcohol consumption, and sexually transmitted infections.    GENERAL RECOMMENDATIONS FOR GOOD HEALTH:  Healthy diet:  Eat a variety of foods, including fruit, vegetables, animal or vegetable protein, such as meat, fish, chicken, and eggs, or beans, lentils, tofu, and grains, such as rice.  Drink plenty of water daily.  Decrease saturated fat in the diet, avoid lots of red meat, processed foods, sweets, fast foods, and fried foods.  Exercise:  Aerobic exercise helps maintain good heart health. At least 30-40 minutes of moderate-intensity exercise is recommended.  For example, a brisk walk that increases your heart rate and breathing. This should be done on most days of the week.   Find a type of exercise or a variety of exercises that you enjoy so that it becomes a part of your daily life.  Examples are running, walking, swimming, water aerobics, and biking.  For motivation and support, explore group exercise such as aerobic class, spin class, Zumba, Yoga,or  martial arts, etc.    Set exercise goals for yourself, such as a certain weight goal, walk or run in a race such as a 5k walk/run.  Speak to your primary care provider about exercise goals.  Disease prevention:  If you smoke or chew tobacco, find out from your caregiver how to quit. It can literally save your life, no matter how long you have been a tobacco user. If you do not use tobacco, never begin.   Maintain a healthy diet and normal weight. Increased weight leads to problems with blood pressure and diabetes.   The Body Mass Index or BMI is a way of measuring how much of your body is fat. Having a BMI above 27 increases the risk of heart disease, diabetes, hypertension, stroke and other problems related to obesity. Your caregiver can help determine your BMI and based on it develop an exercise and dietary program to help you achieve or maintain this important measurement at a healthful level.  High blood pressure causes heart and blood vessel problems.  Persistent high blood pressure should be treated with medicine if weight loss and exercise do not work.   Fat and cholesterol leaves deposits in your arteries   that can block them. This causes heart disease and vessel disease elsewhere in your body.  If your cholesterol is found to be high, or if you have heart disease or certain other medical conditions, then you may need to have your cholesterol monitored frequently and be treated with medication.   Ask if you should have a stress test if your history suggests this. A stress test is a test done on  a treadmill that looks for heart disease. This test can find disease prior to there being a problem.  Avoid drinking alcohol in excess (more than two drinks per day).  Avoid use of street drugs. Do not share needles with anyone. Ask for professional help if you need assistance or instructions on stopping the use of alcohol, cigarettes, and/or drugs.  Brush your teeth twice a day with fluoride toothpaste, and floss once a day. Good oral hygiene prevents tooth decay and gum disease. The problems can be painful, unattractive, and can cause other health problems. Visit your dentist for a routine oral and dental check up and preventive care every 6-12 months.   Look at your skin regularly.  Use a mirror to look at your back. Notify your caregivers of changes in moles, especially if there are changes in shapes, colors, a size larger than a pencil eraser, an irregular border, or development of new moles.  Safety:  Use seatbelts 100% of the time, whether driving or as a passenger.  Use safety devices such as hearing protection if you work in environments with loud noise or significant background noise.  Use safety glasses when doing any work that could send debris in to the eyes.  Use a helmet if you ride a bike or motorcycle.  Use appropriate safety gear for contact sports.  Talk to your caregiver about gun safety.  Use sunscreen with a SPF (or skin protection factor) of 15 or greater.  Lighter skinned people are at a greater risk of skin cancer. Don't forget to also wear sunglasses in order to protect your eyes from too much damaging sunlight. Damaging sunlight can accelerate cataract formation.   Practice safe sex. Use condoms. Condoms are used for birth control and to help reduce the spread of sexually transmitted infections (or STIs).  Some of the STIs are gonorrhea (the clap), chlamydia, syphilis, trichomonas, herpes, HPV (human papilloma virus) and HIV (human immunodeficiency virus) which causes AIDS.  The herpes, HIV and HPV are viral illnesses that have no cure. These can result in disability, cancer and death.   Keep carbon monoxide and smoke detectors in your home functioning at all times. Change the batteries every 6 months or use a model that plugs into the wall.   Vaccinations:  Stay up to date with your tetanus shots and other required immunizations. You should have a booster for tetanus every 10 years. Be sure to get your flu shot every year, since 5%-20% of the [place] population comes down with the flu. The flu vaccine changes each year, so being vaccinated once is not enough. Get your shot in the fall, before the flu season peaks.   Other vaccines to consider:  Pneumococcal vaccine to protect against certain types of pneumonia.  This is normally recommended for adults age 65 or older.  However, adults younger than 74 years old with certain underlying conditions such as diabetes, heart or lung disease should also receive the vaccine.  Shingles vaccine to protect against Varicella Zoster if you are older than age 60, or younger   than 74 years old with certain underlying illness.  Hepatitis A vaccine to protect against a form of infection of the liver by a virus acquired from food.  Hepatitis B vaccine to protect against a form of infection of the liver by a virus acquired from blood or body fluids, particularly if you work in health care.  If you plan to travel internationally, check with your local health department for specific vaccination recommendations.  Cancer Screening:  Most routine colon cancer screening begins at the age of 18. On a yearly basis, doctors may provide special easy to use take-home tests to check for hidden blood in the stool. Sigmoidoscopy or colonoscopy can detect the earliest forms of colon cancer and is life saving. These tests use a small camera at the end of a tube to directly examine the colon. Speak to your caregiver about this at age 70, when routine  screening begins (and is repeated every 5 years unless early forms of pre-cancerous polyps or small growths are found).   At the age of 37 men usually start screening for prostate cancer every year. Screening may begin at a younger age for those with higher risk. Those at higher risk include African-Americans or having a family history of prostate cancer. There are two types of tests for prostate cancer:   Prostate-specific antigen (PSA) testing. Recent studies raise questions about prostate cancer using PSA and you should discuss this with your caregiver.   Digital rectal exam (in which your doctor's lubricated and gloved finger feels for enlargement of the prostate through the anus).   Screening for testicular cancer.  Do a monthly exam of your testicles. Gently roll each testicle between your thumb and fingers, feeling for any abnormal lumps. The best time to do this is after a hot shower or bath when the tissues are looser. Notify your caregivers of any lumps, tenderness or changes in size or shape immediately.    Smoking Cessation Quitting smoking is important to your health and has many advantages. However, it is not always easy to quit since nicotine is a very addictive drug. Often times, people try 3 times or more before being able to quit. This document explains the best ways for you to prepare to quit smoking. Quitting takes hard work and a lot of effort, but you can do it. ADVANTAGES OF QUITTING SMOKING  You will live longer, feel better, and live better.  Your body will feel the impact of quitting smoking almost immediately.  Within 20 minutes, blood pressure decreases. Your pulse returns to its normal level.  After 8 hours, carbon monoxide levels in the blood return to normal. Your oxygen level increases.  After 24 hours, the chance of having a heart attack starts to decrease. Your breath, hair, and body stop smelling like smoke.  After 48 hours, damaged nerve endings begin to  recover. Your sense of taste and smell improve.  After 72 hours, the body is virtually free of nicotine. Your bronchial tubes relax and breathing becomes easier.  After 2 to 12 weeks, lungs can hold more air. Exercise becomes easier and circulation improves.  The risk of having a heart attack, stroke, cancer, or lung disease is greatly reduced.  After 1 year, the risk of coronary heart disease is cut in half.  After 5 years, the risk of stroke falls to the same as a nonsmoker.  After 10 years, the risk of lung cancer is cut in half and the risk of other cancers decreases  significantly.  After 15 years, the risk of coronary heart disease drops, usually to the level of a nonsmoker.  If you are pregnant, quitting smoking will improve your chances of having a healthy baby.  The people you live with, especially any children, will be healthier.  You will have extra money to spend on things other than cigarettes. QUESTIONS TO THINK ABOUT BEFORE ATTEMPTING TO QUIT You may want to talk about your answers with your caregiver.  Why do you want to quit?  If you tried to quit in the past, what helped and what did not?  What will be the most difficult situations for you after you quit? How will you plan to handle them?  Who can help you through the tough times? Your family? Friends? A caregiver?  What pleasures do you get from smoking? What ways can you still get pleasure if you quit? Here are some questions to ask your caregiver:  How can you help me to be successful at quitting?  What medicine do you think would be best for me and how should I take it?  What should I do if I need more help?  What is smoking withdrawal like? How can I get information on withdrawal? GET READY  Set a quit date.  Change your environment by getting rid of all cigarettes, ashtrays, matches, and lighters in your home, car, or work. Do not let people smoke in your home.  Review your past attempts to quit.  Think about what worked and what did not. GET SUPPORT AND ENCOURAGEMENT You have a better chance of being successful if you have help. You can get support in many ways.  Tell your family, friends, and co-workers that you are going to quit and need their support. Ask them not to smoke around you.  Get individual, group, or telephone counseling and support. Programs are available at [employer] and health centers. Call your local health department for information about programs in your area.  Spiritual beliefs and practices may help some smokers quit.  Download a "quit meter" on your computer to keep track of quit statistics, such as how long you have gone without smoking, cigarettes not smoked, and money saved.  Get a self-help book about quitting smoking and staying off of tobacco. Poland yourself from urges to smoke. Talk to someone, go for a walk, or occupy your time with a task.  Change your normal routine. Take a different route to work. Drink tea instead of coffee. Eat breakfast in a different place.  Reduce your stress. Take a hot bath, exercise, or read a book.  Plan something enjoyable to do every day. Reward yourself for not smoking.  Explore interactive web-based programs that specialize in helping you quit. GET MEDICINE AND USE IT CORRECTLY Medicines can help you stop smoking and decrease the urge to smoke. Combining medicine with the above behavioral methods and support can greatly increase your chances of successfully quitting smoking.  Nicotine replacement therapy helps deliver nicotine to your body without the negative effects and risks of smoking. Nicotine replacement therapy includes nicotine gum, lozenges, inhalers, nasal sprays, and skin patches. Some may be available over-the-counter and others require a prescription.  Antidepressant medicine helps people abstain from smoking, but how this works is unknown. This medicine is  available by prescription.  Nicotinic receptor partial agonist medicine simulates the effect of nicotine in your brain. This medicine is available by prescription. Ask your caregiver for advice  about which medicines to use and how to use them based on your health history. Your caregiver will tell you what side effects to look out for if you choose to be on a medicine or therapy. Carefully read the information on the package. Do not use any other product containing nicotine while using a nicotine replacement product.  RELAPSE OR DIFFICULT SITUATIONS Most relapses occur within the first 3 months after quitting. Do not be discouraged if you start smoking again. Remember, most people try several times before finally quitting. You may have symptoms of withdrawal because your body is used to nicotine. You may crave cigarettes, be irritable, feel very hungry, cough often, get headaches, or have difficulty concentrating. The withdrawal symptoms are only temporary. They are strongest when you first quit, but they will go away within 10 14 days. To reduce the chances of relapse, try to:  Avoid drinking alcohol. Drinking lowers your chances of successfully quitting.  Reduce the amount of caffeine you consume. Once you quit smoking, the amount of caffeine in your body increases and can give you symptoms, such as a rapid heartbeat, sweating, and anxiety.  Avoid smokers because they can make you want to smoke.  Do not let weight gain distract you. Many smokers will gain weight when they quit, usually less than 10 pounds. Eat a healthy diet and stay active. You can always lose the weight gained after you quit.  Find ways to improve your mood other than smoking. FOR MORE INFORMATION  www.smokefree.gov  Document Released: 08/09/2001 Document Revised: 02/14/2012 Document Reviewed: 11/24/2011 [employer] Patient Information 2014 [place], [place].

## 2014-01-23 LAB — BASIC METABOLIC PANEL WITH GFR
BUN: 15 mg/dL (ref 6–23)
CALCIUM: 9.3 mg/dL (ref 8.4–10.5)
CO2: 23 mEq/L (ref 19–32)
CREATININE: 1.05 mg/dL (ref 0.50–1.35)
Chloride: 95 mEq/L — ABNORMAL LOW (ref 96–112)
GFR, EST AFRICAN AMERICAN: 80 mL/min
GFR, EST NON AFRICAN AMERICAN: 70 mL/min
Glucose, Bld: 249 mg/dL — ABNORMAL HIGH (ref 70–99)
Potassium: 4.7 mEq/L (ref 3.5–5.3)
Sodium: 134 mEq/L — ABNORMAL LOW (ref 135–145)

## 2014-01-23 LAB — HEPATIC FUNCTION PANEL
ALBUMIN: 4.4 g/dL (ref 3.5–5.2)
ALK PHOS: 62 U/L (ref 39–117)
ALT: 8 U/L (ref 0–53)
AST: 13 U/L (ref 0–37)
BILIRUBIN TOTAL: 0.5 mg/dL (ref 0.2–1.2)
Bilirubin, Direct: 0.1 mg/dL (ref 0.0–0.3)
Indirect Bilirubin: 0.4 mg/dL (ref 0.2–1.2)
TOTAL PROTEIN: 6.8 g/dL (ref 6.0–8.3)

## 2014-01-23 LAB — LIPID PANEL
CHOL/HDL RATIO: 6 ratio
CHOLESTEROL: 238 mg/dL — AB (ref 0–200)
HDL: 40 mg/dL (ref >39)
LDL Cholesterol: 136 mg/dL — ABNORMAL HIGH (ref 0–99)
Triglycerides: 311 mg/dL — ABNORMAL HIGH (ref <150)
VLDL: 62 mg/dL — ABNORMAL HIGH (ref 0–40)

## 2014-01-23 LAB — TSH: TSH: 4.481 u[IU]/mL (ref 0.350–4.500)

## 2014-01-23 LAB — MAGNESIUM: MAGNESIUM: 2 mg/dL (ref 1.5–2.5)

## 2014-01-23 LAB — INSULIN, FASTING: INSULIN FASTING, SERUM: 32 u[IU]/mL — AB (ref 3–28)

## 2014-01-23 LAB — VITAMIN D 25 HYDROXY (VIT D DEFICIENCY, FRACTURES): VIT D 25 HYDROXY: 75 ng/mL (ref 30–89)

## 2014-02-14 ENCOUNTER — Encounter: Payer: Self-pay | Admitting: *Deleted

## 2014-03-05 ENCOUNTER — Other Ambulatory Visit: Payer: Self-pay

## 2014-03-05 ENCOUNTER — Other Ambulatory Visit: Payer: Self-pay | Admitting: Internal Medicine

## 2014-03-05 MED ORDER — VERAPAMIL HCL 120 MG PO TABS: 120.0000 mg | ORAL_TABLET | Freq: Two times a day (BID) | ORAL | Status: AC

## 2014-03-11 ENCOUNTER — Other Ambulatory Visit: Payer: Self-pay | Admitting: Internal Medicine

## 2014-03-21 ENCOUNTER — Other Ambulatory Visit: Payer: Self-pay | Admitting: Emergency Medicine

## 2014-04-02 ENCOUNTER — Encounter: Payer: Self-pay | Admitting: Internal Medicine

## 2014-04-30 ENCOUNTER — Encounter: Payer: Self-pay | Admitting: Internal Medicine

## 2014-04-30 ENCOUNTER — Ambulatory Visit (INDEPENDENT_AMBULATORY_CARE_PROVIDER_SITE_OTHER): Payer: Medicare Other | Admitting: Internal Medicine

## 2014-04-30 VITALS — BP 126/72 | HR 72 | Temp 97.7°F | Resp 16 | Ht 63.0 in | Wt 126.6 lb

## 2014-04-30 DIAGNOSIS — Z1331 Encounter for screening for depression: Secondary | ICD-10-CM

## 2014-04-30 DIAGNOSIS — Z789 Other specified health status: Secondary | ICD-10-CM

## 2014-04-30 DIAGNOSIS — Z1212 Encounter for screening for malignant neoplasm of rectum: Secondary | ICD-10-CM

## 2014-04-30 DIAGNOSIS — Z125 Encounter for screening for malignant neoplasm of prostate: Secondary | ICD-10-CM

## 2014-04-30 DIAGNOSIS — I10 Essential (primary) hypertension: Secondary | ICD-10-CM

## 2014-04-30 DIAGNOSIS — E1149 Type 2 diabetes mellitus with other diabetic neurological complication: Secondary | ICD-10-CM

## 2014-04-30 DIAGNOSIS — E1129 Type 2 diabetes mellitus with other diabetic kidney complication: Secondary | ICD-10-CM

## 2014-04-30 DIAGNOSIS — Z79899 Other long term (current) drug therapy: Secondary | ICD-10-CM

## 2014-04-30 DIAGNOSIS — E785 Hyperlipidemia, unspecified: Secondary | ICD-10-CM

## 2014-04-30 DIAGNOSIS — E559 Vitamin D deficiency, unspecified: Secondary | ICD-10-CM

## 2014-04-30 DIAGNOSIS — Z0000 Encounter for general adult medical examination without abnormal findings: Secondary | ICD-10-CM

## 2014-04-30 LAB — CBC WITH DIFFERENTIAL/PLATELET
Basophils Absolute: 0 10*3/uL (ref 0.0–0.1)
Basophils Relative: 0 % (ref 0–1)
EOS ABS: 0 10*3/uL (ref 0.0–0.7)
Eosinophils Relative: 0 % (ref 0–5)
HCT: 43.4 % (ref 39.0–52.0)
Hemoglobin: 15 g/dL (ref 13.0–17.0)
Lymphocytes Relative: 17 % (ref 12–46)
Lymphs Abs: 1.2 10*3/uL (ref 0.7–4.0)
MCH: 30.4 pg (ref 26.0–34.0)
MCHC: 34.6 g/dL (ref 30.0–36.0)
MCV: 88 fL (ref 78.0–100.0)
Monocytes Absolute: 0.3 10*3/uL (ref 0.1–1.0)
Monocytes Relative: 5 % (ref 3–12)
NEUTROS PCT: 78 % — AB (ref 43–77)
Neutro Abs: 5.4 10*3/uL (ref 1.7–7.7)
PLATELETS: 216 10*3/uL (ref 150–400)
RBC: 4.93 MIL/uL (ref 4.22–5.81)
RDW: 14.1 % (ref 11.5–15.5)
WBC: 6.9 10*3/uL (ref 4.0–10.5)

## 2014-04-30 LAB — HEMOGLOBIN A1C
Hgb A1c MFr Bld: 7.4 % — ABNORMAL HIGH (ref <5.7)
Mean Plasma Glucose: 166 mg/dL — ABNORMAL HIGH (ref <117)

## 2014-04-30 NOTE — Patient Instructions (Signed)
Recommend the book "The END of DIETING" by Dr Baker Janus   and the book "The END of DIABETES " by Dr Excell Seltzer  At [email or web address] - get book & Audio CD's      Being diabetic has a  300% increased risk for heart attack, stroke, cancer, and alzheimer- type vascular dementia. It is very important that you work harder with diet by avoiding all foods that are white except chicken & fish. Avoid white rice (brown & wild rice is OK), white potatoes (sweetpotatoes in moderation is OK), White bread or wheat bread or anything made out of white flour like bagels, donuts, rolls, buns, biscuits, cakes, pastries, cookies, pizza crust, and pasta (made from white flour & egg whites) - vegetarian pasta or spinach or wheat pasta is OK. Multigrain breads like Arnold's or [employer], or multigrain sandwich thins or flatbreads.  Diet, exercise and weight loss can reverse and cure diabetes in the early stages.  Diet, exercise and weight loss is very important in the control and prevention of complications of diabetes which affects every system in your body, ie. Brain - dementia/stroke, eyes - glaucoma/blindness, heart - heart attack/heart failure, kidneys - dialysis, stomach - gastric paralysis, intestines - malabsorption, nerves - severe painful neuritis, circulation - gangrene & loss of a leg(s), and finally cancer and Alzheimers.    I recommend avoid fried & greasy foods,  sweets/candy, white rice (brown or wild rice or Quinoa is OK), white potatoes (sweet potatoes are OK) - anything made from white flour - bagels, doughnuts, rolls, buns, biscuits,white and wheat breads, pizza crust and traditional pasta made of white flour & egg white(vegetarian pasta or spinach or wheat pasta is OK).  Multi-grain bread is OK - like multi-grain flat bread or sandwich thins. Avoid alcohol in excess. Exercise is also important.    Eat all the vegetables you want - avoid meat, especially red meat and dairy - especially cheese.  Cheese  is the most concentrated form of trans-fats which is the worst thing to clog up our arteries. Veggie cheese is OK which can be found in the fresh produce section at [employer] or [employer] or [employer]  Preventive Care for Adults A healthy lifestyle and preventive care can promote health and wellness. Preventive health guidelines for men include the following key practices:  A routine yearly physical is a good way to check with your health care provider about your health and preventative screening. It is a chance to share any concerns and updates on your health and to receive a thorough exam.  Visit your dentist for a routine exam and preventative care every 6 months. Brush your teeth twice a day and floss once a day. Good oral hygiene prevents tooth decay and gum disease.  The frequency of eye exams is based on your age, health, family medical history, use of contact lenses, and other factors. Follow your health care provider's recommendations for frequency of eye exams.  Eat a healthy diet. Foods such as vegetables, fruits, whole grains, low-fat dairy products, and lean protein foods contain the nutrients you need without too many calories. Decrease your intake of foods high in solid fats, added sugars, and salt. Eat the right amount of calories for you.Get information about a proper diet from your health care provider, if necessary.  Regular physical exercise is one of the most important things you can do for your health. Most adults should get at least 150 minutes of moderate-intensity exercise (any activity that  increases your heart rate and causes you to sweat) each week. In addition, most adults need muscle-strengthening exercises on 2 or more days a week.  Maintain a healthy weight. The body mass index (BMI) is a screening tool to identify possible weight problems. It provides an estimate of body fat based on height and weight. Your health care provider can find your BMI and can help you  achieve or maintain a healthy weight.For adults 20 years and older:  A BMI below 18.5 is considered underweight.  A BMI of 18.5 to 24.9 is normal.  A BMI of 25 to 29.9 is considered overweight.  A BMI of 30 and above is considered obese.  Maintain normal blood lipids and cholesterol levels by exercising and minimizing your intake of saturated fat. Eat a balanced diet with plenty of fruit and vegetables. Blood tests for lipids and cholesterol should begin at age 2 and be repeated every 5 years. If your lipid or cholesterol levels are high, you are over 50, or you are at high risk for heart disease, you may need your cholesterol levels checked more frequently.Ongoing high lipid and cholesterol levels should be treated with medicines if diet and exercise are not working.  If you smoke, find out from your health care provider how to quit. If you do not use tobacco, do not start.  Lung cancer screening is recommended for adults aged 68-80 years who are at high risk for developing lung cancer because of a history of smoking. A yearly low-dose CT scan of the lungs is recommended for people who have at least a 30-pack-year history of smoking and are a current smoker or have quit within the past 15 years. A pack year of smoking is smoking an average of 1 pack of cigarettes a day for 1 year (for example: 1 pack a day for 30 years or 2 packs a day for 15 years). Yearly screening should continue until the smoker has stopped smoking for at least 15 years. Yearly screening should be stopped for people who develop a health problem that would prevent them from having lung cancer treatment.  If you choose to drink alcohol, do not have more than 2 drinks per day. One drink is considered to be 12 ounces (355 mL) of beer, 5 ounces (148 mL) of wine, or 1.5 ounces (44 mL) of liquor.  Avoid use of street drugs. Do not share needles with anyone. Ask for help if you need support or instructions about stopping the use of  drugs.  High blood pressure causes heart disease and increases the risk of stroke. Your blood pressure should be checked at least every 1-2 years. Ongoing high blood pressure should be treated with medicines, if weight loss and exercise are not effective.  If you are 34-62 years old, ask your health care provider if you should take aspirin to prevent heart disease.  Diabetes screening involves taking a blood sample to check your fasting blood sugar level. This should be done once every 3 years, after age 57, if you are within normal weight and without risk factors for diabetes. Testing should be considered at a younger age or be carried out more frequently if you are overweight and have at least 1 risk factor for diabetes.  Colorectal cancer can be detected and often prevented. Most routine colorectal cancer screening begins at the age of 57 and continues through age 79. However, your health care provider may recommend screening at an earlier age if you have risk  factors for colon cancer. On a yearly basis, your health care provider may provide home test kits to check for hidden blood in the stool. Use of a small camera at the end of a tube to directly examine the colon (sigmoidoscopy or colonoscopy) can detect the earliest forms of colorectal cancer. Talk to your health care provider about this at age 36, when routine screening begins. Direct exam of the colon should be repeated every 5-10 years through age 83, unless early forms of precancerous polyps or small growths are found.  People who are at an increased risk for hepatitis B should be screened for this virus. You are considered at high risk for hepatitis B if:  You were born in a country where hepatitis B occurs often. Talk with your health care provider about which countries are considered high risk.  Your parents were born in a high-risk country and you have not received a shot to protect against hepatitis B (hepatitis B vaccine).  You have  HIV or AIDS.  You use needles to inject street drugs.  You live with, or have sex with, someone who has hepatitis B.  You are a man who has sex with other men (MSM).  You get hemodialysis treatment.  You take certain medicines for conditions such as cancer, organ transplantation, and autoimmune conditions.  Hepatitis C blood testing is recommended for all people born from [birth year] through 1966 and any individual with known risks for hepatitis C.  Practice safe sex. Use condoms and avoid high-risk sexual practices to reduce the spread of sexually transmitted infections (STIs). STIs include gonorrhea, chlamydia, syphilis, trichomonas, herpes, HPV, and human immunodeficiency virus (HIV). Herpes, HIV, and HPV are viral illnesses that have no cure. They can result in disability, cancer, and death.  If you are at risk of being infected with HIV, it is recommended that you take a prescription medicine daily to prevent HIV infection. This is called preexposure prophylaxis (PrEP). You are considered at risk if:  You are a man who has sex with other men (MSM) and have other risk factors.  You are a heterosexual man, are sexually active, and are at increased risk for HIV infection.  You take drugs by injection.  You are sexually active with a partner who has HIV.  Talk with your health care provider about whether you are at high risk of being infected with HIV. If you choose to begin PrEP, you should first be tested for HIV. You should then be tested every 3 months for as long as you are taking PrEP.  A one-time screening for abdominal aortic aneurysm (AAA) and surgical repair of large AAAs by ultrasound are recommended for men ages 36 to 37 years who are current or former smokers.  Healthy men should no longer receive prostate-specific antigen (PSA) blood tests as part of routine cancer screening. Talk with your health care provider about prostate cancer screening.  Testicular cancer screening is  not recommended for adult males who have no symptoms. Screening includes self-exam, a health care provider exam, and other screening tests. Consult with your health care provider about any symptoms you have or any concerns you have about testicular cancer.  Use sunscreen. Apply sunscreen liberally and repeatedly throughout the day. You should seek shade when your shadow is shorter than you. Protect yourself by wearing long sleeves, pants, a wide-brimmed hat, and sunglasses year round, whenever you are outdoors.  Once a month, do a whole-body skin exam, using a mirror to look  at the skin on your back. Tell your health care provider about new moles, moles that have irregular borders, moles that are larger than a pencil eraser, or moles that have changed in shape or color.  Stay current with required vaccines (immunizations).  Influenza vaccine. All adults should be immunized every year.  Tetanus, diphtheria, and acellular pertussis (Td, Tdap) vaccine. An adult who has not previously received Tdap or who does not know his vaccine status should receive 1 dose of Tdap. This initial dose should be followed by tetanus and diphtheria toxoids (Td) booster doses every 10 years. Adults with an unknown or incomplete history of completing a 3-dose immunization series with Td-containing vaccines should begin or complete a primary immunization series including a Tdap dose. Adults should receive a Td booster every 10 years.  Varicella vaccine. An adult without evidence of immunity to varicella should receive 2 doses or a second dose if he has previously received 1 dose.  Human papillomavirus (HPV) vaccine. Males aged 29-21 years who have not received the vaccine previously should receive the 3-dose series. Males aged 22-26 years may be immunized. Immunization is recommended through the age of 26 years for any male who has sex with males and did not get any or all doses earlier. Immunization is recommended for any  person with an immunocompromised condition through the age of 29 years if he did not get any or all doses earlier. During the 3-dose series, the second dose should be obtained 4-8 weeks after the first dose. The third dose should be obtained 24 weeks after the first dose and 16 weeks after the second dose.  Zoster vaccine. One dose is recommended for adults aged 38 years or older unless certain conditions are present.  Measles, mumps, and rubella (MMR) vaccine. Adults born before [birth year] generally are considered immune to measles and mumps. Adults born in [birth year] or later should have 1 or more doses of MMR vaccine unless there is a contraindication to the vaccine or there is laboratory evidence of immunity to each of the three diseases. A routine second dose of MMR vaccine should be obtained at least 28 days after the first dose for students attending postsecondary schools, health care workers, or international travelers. People who received inactivated measles vaccine or an unknown type of measles vaccine during 1963-1967 should receive 2 doses of MMR vaccine. People who received inactivated mumps vaccine or an unknown type of mumps vaccine before 1979 and are at high risk for mumps infection should consider immunization with 2 doses of MMR vaccine. Unvaccinated health care workers born before [birth year] who lack laboratory evidence of measles, mumps, or rubella immunity or laboratory confirmation of disease should consider measles and mumps immunization with 2 doses of MMR vaccine or rubella immunization with 1 dose of MMR vaccine.  Pneumococcal 13-valent conjugate (PCV13) vaccine. When indicated, a person who is uncertain of his immunization history and has no record of immunization should receive the PCV13 vaccine. An adult aged 68 years or older who has certain medical conditions and has not been previously immunized should receive 1 dose of PCV13 vaccine. This PCV13 should be followed with a dose of pneumococcal  polysaccharide (PPSV23) vaccine. The PPSV23 vaccine dose should be obtained at least 8 weeks after the dose of PCV13 vaccine. An adult aged 95 years or older who has certain medical conditions and previously received 1 or more doses of PPSV23 vaccine should receive 1 dose of PCV13. The PCV13 vaccine dose should be obtained 1  or more years after the last PPSV23 vaccine dose.  Pneumococcal polysaccharide (PPSV23) vaccine. When PCV13 is also indicated, PCV13 should be obtained first. All adults aged 65 years and older should be immunized. An adult younger than age 65 years who has certain medical conditions should be immunized. Any person who resides in a nursing home or long-term care facility should be immunized. An adult smoker should be immunized. People with an immunocompromised condition and certain other conditions should receive both PCV13 and PPSV23 vaccines. People with human immunodeficiency virus (HIV) infection should be immunized as soon as possible after diagnosis. Immunization during chemotherapy or radiation therapy should be avoided. Routine use of PPSV23 vaccine is not recommended for American Indians, [place] Natives, or people younger than 65 years unless there are medical conditions that require PPSV23 vaccine. When indicated, people who have unknown immunization and have no record of immunization should receive PPSV23 vaccine. One-time revaccination 5 years after the first dose of PPSV23 is recommended for people aged 19-64 years who have chronic kidney failure, nephrotic syndrome, asplenia, or immunocompromised conditions. People who received 1-2 doses of PPSV23 before age 65 years should receive another dose of PPSV23 vaccine at age 65 years or later if at least 5 years have passed since the previous dose. Doses of PPSV23 are not needed for people immunized with PPSV23 at or after age 65 years.  Meningococcal vaccine. Adults with asplenia or persistent complement component deficiencies  should receive 2 doses of quadrivalent meningococcal conjugate (MenACWY-D) vaccine. The doses should be obtained at least 2 months apart. Microbiologists working with certain meningococcal bacteria, military recruits, people at risk during an outbreak, and people who travel to or live in countries with a high rate of meningitis should be immunized. A first-year college student up through age 21 years who is living in a residence hall should receive a dose if he did not receive a dose on or after his 16th birthday. Adults who have certain high-risk conditions should receive one or more doses of vaccine.  Hepatitis A vaccine. Adults who wish to be protected from this disease, have certain high-risk conditions, work with hepatitis A-infected animals, work in hepatitis A research labs, or travel to or work in countries with a high rate of hepatitis A should be immunized. Adults who were previously unvaccinated and who anticipate close contact with an international adoptee during the first 60 days after arrival in the [place] from a country with a high rate of hepatitis A should be immunized.  Hepatitis B vaccine. Adults should be immunized if they wish to be protected from this disease, have certain high-risk conditions, may be exposed to blood or other infectious body fluids, are household contacts or sex partners of hepatitis B positive people, are clients or workers in certain care facilities, or travel to or work in countries with a high rate of hepatitis B.  Haemophilus influenzae type b (Hib) vaccine. A previously unvaccinated person with asplenia or sickle cell disease or having a scheduled splenectomy should receive 1 dose of Hib vaccine. Regardless of previous immunization, a recipient of a hematopoietic stem cell transplant should receive a 3-dose series 6-12 months after his successful transplant. Hib vaccine is not recommended for adults with HIV infection. Preventive Service /  Frequency   Ages 65 and over  Blood pressure check.** / Every 1 to 2 years.  Lipid and cholesterol check.**/ Every 5 years beginning at age 20.  Lung cancer screening. / Every year if you are aged   55-80 years and have a 30-pack-year history of smoking and currently smoke or have quit within the past 15 years. Yearly screening is stopped once you have quit smoking for at least 15 years or develop a health problem that would prevent you from having lung cancer treatment.  Fecal occult blood test (FOBT) of stool. / Every year beginning at age 50 and continuing until age 75. You may not have to do this test if you get a colonoscopy every 10 years.  Flexible sigmoidoscopy** or colonoscopy.** / Every 5 years for a flexible sigmoidoscopy or every 10 years for a colonoscopy beginning at age 50 and continuing until age 75.  Hepatitis C blood test.** / For all people born from 1945 through 1965 and any individual with known risks for hepatitis C.  Abdominal aortic aneurysm (AAA) screening for persons with history of hypertension or who are current or former smokers.  Skin self-exam. / Monthly.  Influenza vaccine. / Every year.  Tetanus, diphtheria, and acellular pertussis (Tdap/Td) vaccine.** / 1 dose of Td every 10 years.  Varicella vaccine.** / Consult your health care provider.  Zoster vaccine.** / 1 dose for adults aged 60 years or older.  Pneumococcal 13-valent conjugate (PCV13) vaccine.** / Consult your health care provider.  Pneumococcal polysaccharide (PPSV23) vaccine.** / 1 dose for all adults aged 65 years and older.  Meningococcal vaccine.** / Consult your health care provider.  Hepatitis A vaccine.** / Consult your health care provider.  Hepatitis B vaccine.** / Consult your health care provider.  Haemophilus influenzae type b (Hib) vaccine.** / Consult your health care provider.   

## 2014-04-30 NOTE — Progress Notes (Signed)
Patient ID: Tim Lyons, male   DOB: Jun 09, 1941, 73 y.o.   MRN: [id number]   Annual Screening Comprehensive Examination  This very nice 73 y.o.WWM presents for complete physical.  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   HTN predates since 73 when he had his 1st MI.  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 In 2004 he had a permanent Pacemaker for SSS and replacement in 2012.  Patient's BP has been controlled and today's BP is 126/72 mmHg.  Patient denies any cardiac symptoms as chest pain, palpitations, shortness of breath, dizziness or ankle swelling.   Patient's hyperlipidemia is controlled with diet and medications. Patient denies myalgias or other medication SE's. Last lipids were  Chol 238*; HDL  40; LDL136*; Trig 311 on 01/22/2014.   Patient has T2DM with CKD2 and peripheral neuropathy. Patient denies reactive hypoglycemic symptoms, visual blurring or diabetic polys, but does c/o numb paresthesias of feet and toes and he denies pain. Last A1c was 7.3% on 01/22/2014.   Finally, patient has history of Vitamin D Deficiency and last vitamin D was 82 on 01/22/2014.  Medication Sig  . aspirin 81 MG tablet Take 81 mg by mouth daily.    Marland Kitchen atenolol (TENORMIN) 100 MG tablet TAKE ONE TABLET BY MOUTH ONE TIME DAILY for blood pressure and heart  . Cholecalciferol (VITAMIN D PO) Take 2,500 Units by mouth 1 day or 1 dose.    Marland Kitchen doxazosin (CARDURA) 8 MG tablet TAKE ONE TABLET BY MOUTH AT BEDTIME FOR BLOOD PRESSURE   . glyBURIDE (DIABETA) 5 MG tablet Take 5 mg by mouth 3 (three) times daily.    . metFORMIN (GLUCOPHAGE-XR) 500 MG 24 hr tablet Take 1 tablet by mouth twice daily at breakfast & lunch and 2 tablets at supper time.  . pravastatin (PRAVACHOL) 40 MG tablet Take 40 mg by mouth daily.    . verapamil (CALAN) 120 MG tablet Take 1 tablet (120 mg total) by mouth 2 (two) times daily.    Allergies   Allergen Reactions  . Ace Inhibitors Cough  . Sulfonamide Derivatives     REACTION: upset stomach  . Zoloft [Sertraline Hcl] Diarrhea   Past Medical History  Diagnosis Date  . CAD (coronary artery disease)   . Polycythemia   . Bilirubinemia   . PAD (peripheral artery disease)   . Hernia   . Diverticulitis   . Type II or unspecified type diabetes mellitus without mention of complication, not stated as uncontrolled   . Arthritis   . COPD (chronic obstructive pulmonary disease)   . Hypertension   . Hyperlipidemia   . Vitamin D deficiency    Past Surgical History  Procedure Laterality Date  . Carotid endarterectomy    . Coronary artery bypass graft    . Medtronic    . Heart bypass  1995  . Pacemaker insertion    . Colon surgery     Family History  Problem Relation Age of Onset  . Hypertension Mother   . Emphysema Father    History   Social History  . Marital Status: Widowed    Spouse Name: N/A    Number of Children: N/A  . Years of Education: N/A   Occupational History  . Retired traveling [employer]  . Smoking status: Current Some Day Smoker -- 0.50 packs/day  . Smokeless tobacco: Not on file  Comment: trying to quit  . Alcohol Use: Yes     Comment: 2 beers but rarely  . Drug Use: No  . Sexual Activity: Not on file    ROS Constitutional: Denies fever, chills, weight loss/gain, headaches, insomnia, fatigue, night sweats or change in appetite. Eyes: Denies redness, blurred vision, diplopia, discharge, itchy or watery eyes.  ENT: Denies discharge, congestion, post nasal drip, epistaxis, sore throat, earache, hearing loss, dental pain, Tinnitus, Vertigo, Sinus pain or snoring.  Cardio: Denies chest pain, palpitations, irregular heartbeat, syncope, dyspnea, diaphoresis, orthopnea, PND, claudication or edema Respiratory: denies cough, dyspnea, DOE, pleurisy, hoarseness, laryngitis or wheezing.  Gastrointestinal: Denies dysphagia,  heartburn, reflux, water brash, pain, cramps, nausea, vomiting, bloating, diarrhea, constipation, hematemesis, melena, hematochezia, jaundice or hemorrhoids Genitourinary: Denies dysuria, frequency, urgency, nocturia, hesitancy, discharge, hematuria or flank pain Musculoskeletal: Denies arthralgia, myalgia, stiffness, Jt. Swelling, pain, limp or strain/sprain. Denies Falls. Skin: Denies puritis, rash, hives, warts, acne, eczema or change in skin lesion Neuro: No weakness, tremor, incoordination, spasms  or pain, but does c/o numb  Paresthesia of soles of feet & toes. Psychiatric: Denies confusion, memory loss or sensory loss. Denies Depression. Endocrine: Denies change in weight, skin, hair change, nocturia, and paresthesia, diabetic polys, visual blurring or hyper / hypo glycemic episodes.  Heme/Lymph: No excessive bleeding, bruising or enlarged lymph nodes.   Physical Exam  BP 126/72  Pulse 72  Temp(Src) 97.7 F (36.5 C) (Temporal)  Resp 16  Ht 5\' 3"  (1.6 m)  Wt 126 lb 9.6 oz (57.425 kg)  BMI 22.43 kg/m2  General Appearance: Well nourished, in no apparent distress. Eyes: PERRLA, EOMs, conjunctiva no swelling or erythema, normal fundi and vessels. Sinuses: No frontal/maxillary tenderness ENT/Mouth: EACs patent / TMs  nl. Nares clear without erythema, swelling, mucoid exudates. Oral hygiene is good. No erythema, swelling, or exudate. Tongue normal, non-obstructing. Tonsils not swollen or erythematous. Hearing normal.  Neck: Supple, thyroid normal. No bruits, nodes or JVD. Respiratory: Respiratory effort normal.  BS equal and clear bilateral without rales, rhonci, wheezing or stridor. Cardio: Heart sounds are normal with regular rate and rhythm and no murmurs, rubs or gallops. Peripheral pulses are normal and equal bilaterally without edema. No aortic or femoral bruits. Chest:  Median sternotomy scar. Symmetric with normal excursions and percussion.  Abdomen: Flat, soft, with bowl sounds.  Nontender, no guarding, rebound, hernias, masses, or organomegaly.  Lymphatics: Non tender without lymphadenopathy.  Genitourinary: No hernias.Testes nl. DRE - prostate nl for age - smooth & firm w/o nodules. Musculoskeletal: Full ROM all peripheral extremities, joint stability, 5/5 strength, and normal gait. Skin: Warm and dry without rashes, lesions, cyanosis, clubbing or  ecchymosis.  Neuro: Cranial nerves intact, reflexes equal bilaterally. Normal muscle tone, no cerebellar symptoms. Sensation decreased bilat feet in a stocking distribution. Pysch: Awake and oriented X 3with normal affect, insight and judgment appropriate.   Assessment and Plan  1. Annual Screening Examination 2. Hypertension  3. ASCAD s/p CABG and Pacemaker  4. Hyperlipidemia 5. T2_NIDDM 6. Vitamin D Deficiency  Continue prudent diet as discussed, weight control, BP monitoring, regular exercise, and medications as discussed.  Discussed med effects and SE's. Routine screening labs and tests as requested with regular follow-up as recommended.

## 2014-05-01 LAB — BASIC METABOLIC PANEL WITH GFR
BUN: 14 mg/dL (ref 6–23)
CALCIUM: 9.7 mg/dL (ref 8.4–10.5)
CO2: 28 meq/L (ref 19–32)
Chloride: 96 mEq/L (ref 96–112)
Creat: 1.03 mg/dL (ref 0.50–1.35)
GFR, EST NON AFRICAN AMERICAN: 71 mL/min
GFR, Est African American: 82 mL/min
Glucose, Bld: 216 mg/dL — ABNORMAL HIGH (ref 70–99)
Potassium: 4.6 mEq/L (ref 3.5–5.3)
SODIUM: 133 meq/L — AB (ref 135–145)

## 2014-05-01 LAB — LIPID PANEL
CHOL/HDL RATIO: 6.1 ratio
Cholesterol: 244 mg/dL — ABNORMAL HIGH (ref 0–200)
HDL: 40 mg/dL (ref >39)
TRIGLYCERIDES: 474 mg/dL — AB (ref <150)

## 2014-05-01 LAB — URINALYSIS, MICROSCOPIC ONLY
BACTERIA UA: NONE SEEN
CASTS: NONE SEEN
CRYSTALS: NONE SEEN
Squamous Epithelial / LPF: NONE SEEN

## 2014-05-01 LAB — HEPATIC FUNCTION PANEL
ALT: <8 U/L (ref 0–53)
AST: 12 U/L (ref 0–37)
Albumin: 4.4 g/dL (ref 3.5–5.2)
Alkaline Phosphatase: 68 U/L (ref 39–117)
BILIRUBIN DIRECT: 0.1 mg/dL (ref 0.0–0.3)
BILIRUBIN TOTAL: 0.5 mg/dL (ref 0.2–1.2)
Indirect Bilirubin: 0.4 mg/dL (ref 0.2–1.2)
Total Protein: 6.9 g/dL (ref 6.0–8.3)

## 2014-05-01 LAB — MICROALBUMIN / CREATININE URINE RATIO
Creatinine, Urine: 123.3 mg/dL
MICROALB UR: 1.14 mg/dL (ref 0.00–1.89)
MICROALB/CREAT RATIO: 9.2 mg/g (ref 0.0–30.0)

## 2014-05-01 LAB — VITAMIN D 25 HYDROXY (VIT D DEFICIENCY, FRACTURES): Vit D, 25-Hydroxy: 66 ng/mL (ref 30–89)

## 2014-05-01 LAB — INSULIN, FASTING: Insulin fasting, serum: 21.3 u[IU]/mL — ABNORMAL HIGH (ref 2.0–19.6)

## 2014-05-01 LAB — TSH: TSH: 3.233 u[IU]/mL (ref 0.350–4.500)

## 2014-05-01 LAB — MAGNESIUM: Magnesium: 1.9 mg/dL (ref 1.5–2.5)

## 2014-05-03 NOTE — Addendum Note (Signed)
Addended by: Unk Pinto on: 05/03/2014 05:36 PM   Modules accepted: Level of Service

## 2014-05-12 ENCOUNTER — Other Ambulatory Visit (INDEPENDENT_AMBULATORY_CARE_PROVIDER_SITE_OTHER): Payer: Medicare Other | Admitting: *Deleted

## 2014-05-12 DIAGNOSIS — Z1212 Encounter for screening for malignant neoplasm of rectum: Secondary | ICD-10-CM

## 2014-05-12 LAB — POC HEMOCCULT BLD/STL (HOME/3-CARD/SCREEN)
Card #3 Fecal Occult Blood, POC: NEGATIVE
FECAL OCCULT BLD: NEGATIVE
FECAL OCCULT BLD: NEGATIVE

## 2014-05-14 ENCOUNTER — Encounter: Payer: Self-pay | Admitting: Gastroenterology

## 2014-06-07 ENCOUNTER — Emergency Department (HOSPITAL_COMMUNITY): Payer: Medicare Other

## 2014-06-07 ENCOUNTER — Encounter (HOSPITAL_COMMUNITY): Payer: Self-pay | Admitting: Emergency Medicine

## 2014-06-07 ENCOUNTER — Emergency Department (HOSPITAL_COMMUNITY)
Admission: EM | Admit: 2014-06-07 | Discharge: 2014-06-07 | Disposition: A | Discharge status: Home / Self Care | Payer: Medicare Other | Attending: Emergency Medicine | Admitting: Emergency Medicine

## 2014-06-07 DIAGNOSIS — I10 Essential (primary) hypertension: Secondary | ICD-10-CM | POA: Insufficient documentation

## 2014-06-07 DIAGNOSIS — Z8719 Personal history of other diseases of the digestive system: Secondary | ICD-10-CM | POA: Insufficient documentation

## 2014-06-07 DIAGNOSIS — M545 Low back pain: Secondary | ICD-10-CM | POA: Diagnosis not present

## 2014-06-07 DIAGNOSIS — Z950 Presence of cardiac pacemaker: Secondary | ICD-10-CM | POA: Diagnosis not present

## 2014-06-07 DIAGNOSIS — Z862 Personal history of diseases of the blood and blood-forming organs and certain disorders involving the immune mechanism: Secondary | ICD-10-CM | POA: Insufficient documentation

## 2014-06-07 DIAGNOSIS — Z79899 Other long term (current) drug therapy: Secondary | ICD-10-CM | POA: Insufficient documentation

## 2014-06-07 DIAGNOSIS — Z955 Presence of coronary angioplasty implant and graft: Secondary | ICD-10-CM | POA: Insufficient documentation

## 2014-06-07 DIAGNOSIS — E559 Vitamin D deficiency, unspecified: Secondary | ICD-10-CM | POA: Insufficient documentation

## 2014-06-07 DIAGNOSIS — Z7982 Long term (current) use of aspirin: Secondary | ICD-10-CM | POA: Diagnosis not present

## 2014-06-07 DIAGNOSIS — I2510 Atherosclerotic heart disease of native coronary artery without angina pectoris: Secondary | ICD-10-CM | POA: Insufficient documentation

## 2014-06-07 DIAGNOSIS — J449 Chronic obstructive pulmonary disease, unspecified: Secondary | ICD-10-CM | POA: Insufficient documentation

## 2014-06-07 DIAGNOSIS — Z720 Tobacco use: Secondary | ICD-10-CM | POA: Insufficient documentation

## 2014-06-07 DIAGNOSIS — M1990 Unspecified osteoarthritis, unspecified site: Secondary | ICD-10-CM | POA: Insufficient documentation

## 2014-06-07 DIAGNOSIS — E119 Type 2 diabetes mellitus without complications: Secondary | ICD-10-CM | POA: Insufficient documentation

## 2014-06-07 LAB — I-STAT CHEM 8, ED
BUN: 16 mg/dL (ref 6–23)
Calcium, Ion: 1.12 mmol/L — ABNORMAL LOW (ref 1.13–1.30)
Chloride: 104 mEq/L (ref 96–112)
Creatinine, Ser: 0.8 mg/dL (ref 0.50–1.35)
Glucose, Bld: 182 mg/dL — ABNORMAL HIGH (ref 70–99)
HCT: 47 % (ref 39.0–52.0)
Hemoglobin: 16 g/dL (ref 13.0–17.0)
Potassium: 4.3 mEq/L (ref 3.7–5.3)
SODIUM: 135 meq/L — AB (ref 137–147)
TCO2: 24 mmol/L (ref 0–100)

## 2014-06-07 MED ORDER — HYDROCODONE-ACETAMINOPHEN 5-325 MG PO TABS
1.0000 | ORAL_TABLET | Freq: Once | ORAL | Status: AC
  Administered 2014-06-07: 1 via ORAL
  Filled 2014-06-07: qty 1

## 2014-06-07 MED ORDER — HYDROCODONE-ACETAMINOPHEN 5-325 MG PO TABS: 1.0000 | ORAL_TABLET | Freq: Four times a day (QID) | ORAL | Status: DC | PRN

## 2014-06-07 NOTE — ED Provider Notes (Signed)
CSN: [id number]     Arrival date & time 06/07/14  1125 History   First MD Initiated Contact with Patient 06/07/14 1355     Chief Complaint  Patient presents with  . Back Pain     HPI Patient reports ongoing back pain over the past 10 years.  His daughter is also concerned about his memory over the past several years in his ability to care for himself at home.  He continues to cook and eat on his own.  He dresses himself.  The patient is not overly concerned about his ability to live on his own.  He denies fevers and chills.  His daughter came from out of town today and brought him to the emergency department for evaluation.  Denies weight loss.  No abdominal pain.  No chest pain or shortness of breath.  No weakness of the arms or legs.  Ambulatory.    Past Medical History  Diagnosis Date  . CAD (coronary artery disease)   . Polycythemia   . Bilirubinemia   . PAD (peripheral artery disease)   . Hernia   . Diverticulitis   . Type II or unspecified type diabetes mellitus without mention of complication, not stated as uncontrolled   . Arthritis   . COPD (chronic obstructive pulmonary disease)   . Hypertension   . Hyperlipidemia   . Vitamin D deficiency    Past Surgical History  Procedure Laterality Date  . Carotid endarterectomy    . Coronary artery bypass graft    . Medtronic    . Heart bypass  1995  . Pacemaker insertion    . Colon surgery     Family History  Problem Relation Age of Onset  . Hypertension Mother   . Emphysema Father    History  Substance Use Topics  . Smoking status: Current Every Day Smoker -- 0.50 packs/day  . Smokeless tobacco: Not on file     Comment: trying to quit. says he smokes about 2 cigarettes per month  . Alcohol Use: Yes     Comment: 2 beers but rarely    Review of Systems  All other systems reviewed and are negative.     Allergies  Ace inhibitors; Sulfonamide derivatives; and Zoloft  Home Medications   Prior to Admission  medications   Medication Sig Start Date End Date Taking? Authorizing Provider  aspirin 81 MG tablet Take 81 mg by mouth daily.     Yes Historical Provider, MD  atenolol (TENORMIN) 100 MG tablet Take 100 mg by mouth daily.   Yes Historical Provider, MD  Cholecalciferol (VITAMIN D PO) Take 2,000 Units by mouth daily.    Yes Historical Provider, MD  doxazosin (CARDURA) 8 MG tablet Take 8 mg by mouth daily.   Yes Historical Provider, MD  metFORMIN (GLUCOPHAGE-XR) 500 MG 24 hr tablet Take 1 tablet by mouth twice daily at breakfast & lunch and 2 tablets at supper time. 12/20/13  Yes Unk Pinto, MD  verapamil (CALAN) 120 MG tablet Take 1 tablet (120 mg total) by mouth 2 (two) times daily. 03/05/14  Yes Unk Pinto, MD   BP 142/66  Pulse 69  Temp(Src) 97.8 F (36.6 C) (Oral)  Resp 18  SpO2 100% Physical Exam  Nursing note and vitals reviewed. Constitutional: He is oriented to person, place, and time. He appears well-developed and well-nourished.  HENT:  Head: Normocephalic and atraumatic.  Eyes: EOM are normal.  Neck: Normal range of motion.  Cardiovascular: Normal rate, regular rhythm,  normal heart sounds and intact distal pulses.   Pulmonary/Chest: Effort normal and breath sounds normal. No respiratory distress.  Abdominal: Soft. He exhibits no distension. There is no tenderness.  Musculoskeletal: Normal range of motion.  Mild lumbar and paralumbar tenderness without spasm.  5 out of 5 strength in bilateral upper lower extremity major muscle groups.  Neurological: He is alert and oriented to person, place, and time.  Skin: Skin is warm and dry.  Psychiatric: He has a normal mood and affect. Judgment normal.    ED Course  Procedures (including critical care time) Labs Review Labs Reviewed  I-STAT CHEM 8, ED - Abnormal; Notable for the following:    Sodium 135 (*)    Glucose, Bld 182 (*)    Calcium, Ion 1.12 (*)    All other components within normal limits    Imaging  Review Dg Lumbar Spine Complete  06/07/2014   CLINICAL DATA:  Chronic back pain for 10 years.  EXAM: LUMBAR SPINE - COMPLETE 4+ VIEW  COMPARISON:  March 21, 2012  FINDINGS: There is no evidence of lumbar spine fracture. Alignment is normal. There are degenerative joint changes throughout lumbar spine with narrowed joint space and osteophyte formation.  IMPRESSION: No acute fracture or dislocation. Osteoarthritic changes of lumbar spine not significantly changed compared to prior exam.   Electronically Signed   By: Abelardo Diesel M.D.   On: 06/07/2014 15:20  I personally reviewed the imaging tests through PACS system I reviewed available ER/hospitalization records through the EMR    EKG Interpretation None      MDM   Final diagnoses:  Low back pain without sciatica, unspecified back pain laterality   Patient is feeling better at this time.  His ambulatory.  Plain films demonstrate arthritic changes.  No significant lab abnormalities.  PCP followup.  Home with a short course of pain medicine.  He has no weakness of his legs.  He does not need an MRI of his back now.    Hoy Morn, MD 06/07/14 [phone]

## 2014-06-07 NOTE — ED Notes (Signed)
Spoke to pt and his daughter about delay in care and explained to them it might be awhile before EDP comes in due to high acuity in the department, they voiced understanding. Warm blankets given and snacks/drinks offered to pt's family.

## 2014-06-07 NOTE — ED Notes (Signed)
MD at bedside. 

## 2014-06-07 NOTE — ED Notes (Addendum)
Initial contact-was seen at a chiropractor around ten years ago and claims when they were turning his head from side to side and said "they popped it wrong and it took me an hour to get to the car." Reports lower back pain since then. Has numbness in bilateral legs and has taken multiple falls down the stairs at his home. Says "it feels like my right foot isn't even there." Hx MI, has a permanent pacemaker in place. No history of strokes. Ambulatory to wheelchair. Moving all extremities equally. RR even/unlabored. Speaking full,clear sentences. No other questions/concerns. Daughter at bedside.

## 2014-06-23 ENCOUNTER — Other Ambulatory Visit: Payer: Self-pay | Admitting: Physician Assistant

## 2014-08-14 ENCOUNTER — Encounter: Payer: Self-pay | Admitting: Physician Assistant

## 2014-08-14 ENCOUNTER — Ambulatory Visit (INDEPENDENT_AMBULATORY_CARE_PROVIDER_SITE_OTHER): Payer: Medicare Other | Admitting: Physician Assistant

## 2014-08-14 VITALS — BP 120/70 | HR 72 | Temp 98.4°F | Resp 16 | Ht 63.0 in | Wt 123.0 lb

## 2014-08-14 DIAGNOSIS — E1143 Type 2 diabetes mellitus with diabetic autonomic (poly)neuropathy: Secondary | ICD-10-CM

## 2014-08-14 DIAGNOSIS — E1122 Type 2 diabetes mellitus with diabetic chronic kidney disease: Secondary | ICD-10-CM

## 2014-08-14 DIAGNOSIS — I25810 Atherosclerosis of coronary artery bypass graft(s) without angina pectoris: Secondary | ICD-10-CM

## 2014-08-14 DIAGNOSIS — Z79899 Other long term (current) drug therapy: Secondary | ICD-10-CM

## 2014-08-14 DIAGNOSIS — J449 Chronic obstructive pulmonary disease, unspecified: Secondary | ICD-10-CM

## 2014-08-14 DIAGNOSIS — I10 Essential (primary) hypertension: Secondary | ICD-10-CM

## 2014-08-14 DIAGNOSIS — E559 Vitamin D deficiency, unspecified: Secondary | ICD-10-CM

## 2014-08-14 DIAGNOSIS — N182 Chronic kidney disease, stage 2 (mild): Secondary | ICD-10-CM

## 2014-08-14 DIAGNOSIS — E785 Hyperlipidemia, unspecified: Secondary | ICD-10-CM

## 2014-08-14 LAB — HEPATIC FUNCTION PANEL
ALBUMIN: 4.1 g/dL (ref 3.5–5.2)
ALK PHOS: 64 U/L (ref 39–117)
ALT: <8 U/L (ref 0–53)
AST: 10 U/L (ref 0–37)
BILIRUBIN INDIRECT: 0.3 mg/dL (ref 0.2–1.2)
BILIRUBIN TOTAL: 0.4 mg/dL (ref 0.2–1.2)
Bilirubin, Direct: 0.1 mg/dL (ref 0.0–0.3)
Total Protein: 6.8 g/dL (ref 6.0–8.3)

## 2014-08-14 LAB — BASIC METABOLIC PANEL WITH GFR
BUN: 15 mg/dL (ref 6–23)
CHLORIDE: 99 meq/L (ref 96–112)
CO2: 26 meq/L (ref 19–32)
CREATININE: 0.96 mg/dL (ref 0.50–1.35)
Calcium: 9.8 mg/dL (ref 8.4–10.5)
GFR, EST NON AFRICAN AMERICAN: 78 mL/min
GFR, Est African American: >89 mL/min
Glucose, Bld: 229 mg/dL — ABNORMAL HIGH (ref 70–99)
POTASSIUM: 5.2 meq/L (ref 3.5–5.3)
Sodium: 137 mEq/L (ref 135–145)

## 2014-08-14 LAB — LIPID PANEL
Cholesterol: 228 mg/dL — ABNORMAL HIGH (ref 0–200)
HDL: 38 mg/dL — AB (ref >39)
LDL CALC: 120 mg/dL — AB (ref 0–99)
Total CHOL/HDL Ratio: 6 Ratio
Triglycerides: 350 mg/dL — ABNORMAL HIGH (ref <150)
VLDL: 70 mg/dL — ABNORMAL HIGH (ref 0–40)

## 2014-08-14 LAB — MAGNESIUM: MAGNESIUM: 2.1 mg/dL (ref 1.5–2.5)

## 2014-08-14 LAB — TSH: TSH: 3.518 u[IU]/mL (ref 0.350–4.500)

## 2014-08-14 MED ORDER — PREGABALIN 75 MG PO CAPS: 75.0000 mg | ORAL_CAPSULE | Freq: Three times a day (TID) | ORAL | Status: AC

## 2014-08-14 NOTE — Patient Instructions (Signed)
The doxazosin can cause dizziness, please cut it in half at night and only take 1/2 a pill.  Monitor your BP at home, call if it goes above 140/90.   Can take the lyrica samples for nerve pain. It can make you sleepy so we suggest trying it at night first and please plan to not drive or do anything strenuous. Also please do not take this medication with alcohol.  Start out 1 pill at night before bed, can increase to 2 pills at night before bed. Please call the office if you have any side effects.   Can take 3 pills a day however you would like examples - 1 breakfast, lunch, bedtime. - 1 at breakfast, 2 at bed time  How should I use this medicine? Take this medicine by mouth with a glass of water. Follow the directions on the prescription label. You can take this medicine with or without food. Take your doses at regular intervals. Do not take your medicine more often than directed. Do not stop taking except on your doctor's advice.  What if I miss a dose? If you miss a dose, take it as soon as you can. If it is almost time for your next dose, take only that dose. Do not take double or extra doses.  What should I watch for while using this medicine? Tell your doctor or healthcare professional if your symptoms do not start to get better or if they get worse.   You may get drowsy or dizzy. Do not drive, use machinery, or do anything that needs mental alertness until you know how this medicine affects you. Do not stand or sit up quickly, especially if you are an older patient. This reduces the risk of dizzy or fainting spells. Alcohol may interfere with the effect of this medicine. Avoid alcoholic drinks. If you have a heart condition, like congestive heart failure, and notice that you are retaining water and have swelling in your hands or feet, contact your health care provider immediately.  What side effects may I notice from receiving this medicine? Side effects that you should report to your  doctor or health care professional as soon as possible and are very rare: -allergic reactions like skin rash, itching or hives, swelling of the face, lips, or tongue -breathing problems -changes in vision -jerking or unusual movements of any part of your body -suicidal thoughts or other mood changes -swelling of the ankles, feet, hands -unusual bruising or bleeding  Side effects that usually do not require medical attention (Report these to your doctor or health care professional if they continue or are bothersome.): -dizziness -drowsiness -dry mouth -nausea -tremors

## 2014-08-14 NOTE — Progress Notes (Signed)
Assessment and Plan:  Hypertension: Continue medication, cut cardura in half, may add on losartan in 1 month, monitor blood pressure at home. Continue DASH diet.  Reminder to go to the ER if any CP, SOB, nausea, dizziness, severe HA, changes vision/speech, left arm numbness and tingling and jaw pain. Cholesterol: Continue diet and exercise. Check cholesterol.  Diabetes with diabetic chronic kidney disease, with diabetic peripheral angiopathy without gangrene and with diabetic polyneuropathy-Continue diet and exercise. Check A1C Vitamin D Def- check level and continue medications.  Dizziness/falls- suggest cutting cardura in half, will add losartan next visit for kidney/heart. P. Neuropathy due to DM- lyrica samples given, will follow up in 1 month/after new year.   Continue diet and meds as discussed. Further disposition pending results of labs. Discussed med's effects and SE's.  Follow up 1 month.   HPI 73 y.o. male  presents for 3 month follow up with hypertension, hyperlipidemia, diabetes and vitamin D. His blood pressure has been controlled at home, today their BP is BP: 120/70 mmHg  He has CAD due to DM, first MI in 1990, had CABG in 1996. He has SSS with pacemaker placed in 2012.  He does workout. He denies chest pain, shortness of breath. He states he does get dizzy with standing.  He is not on cholesterol medication and denies myalgias. His cholesterol is not at goal. The cholesterol last visit was:   Lab Results  Component Value Date   CHOL 244* 04/30/2014   HDL 40 04/30/2014   LDLCALC NOT CALC 04/30/2014   TRIG 474* 04/30/2014   CHOLHDL 6.1 04/30/2014   He has been working on diet and exercise for Diabetes with diabetic chronic kidney disease and with diabetic polyneuropathy, he is on bASA, he is not on ACE/ARB, and denies polydipsia, polyuria and visual disturbances. Denies any low blood sugars.  Last A1C was:  Lab Results  Component Value Date   HGBA1C 7.4* 04/30/2014    Patient is on Vitamin D supplement.   Lab Results  Component Value Date   VD25OH 66 04/30/2014     He went to the ER in Oct for back pain and his daughter is afraid that his memory is not as good, though he is still living by himself, cooks for himself. He has good long term recall, is oriented X 3 but can not recall 3 words.   Current Medications:  Current Outpatient Prescriptions on File Prior to Visit  Medication Sig Dispense Refill  . aspirin 81 MG tablet Take 81 mg by mouth daily.      Marland Kitchen atenolol (TENORMIN) 100 MG tablet Take 100 mg by mouth daily.    . Cholecalciferol (VITAMIN D PO) Take 2,000 Units by mouth daily.     Marland Kitchen doxazosin (CARDURA) 8 MG tablet TAKE ONE TABLET BY MOUTH NIGHTLY AT BEDTIME FOR BLOOD PRESSURE 90 tablet 3  . HYDROcodone-acetaminophen (NORCO/VICODIN) 5-325 MG per tablet Take 1 tablet by mouth every 6 (six) hours as needed for moderate pain. 15 tablet 0  . metFORMIN (GLUCOPHAGE-XR) 500 MG 24 hr tablet Take 1 tablet by mouth twice daily at breakfast & lunch and 2 tablets at supper time. 360 tablet 2  . verapamil (CALAN) 120 MG tablet Take 1 tablet (120 mg total) by mouth 2 (two) times daily. 180 tablet PRN   No current facility-administered medications on file prior to visit.   Medical History:  Past Medical History  Diagnosis Date  . CAD (coronary artery disease)   . Polycythemia   .  Bilirubinemia   . PAD (peripheral artery disease)   . Hernia   . Diverticulitis   . Type II or unspecified type diabetes mellitus without mention of complication, not stated as uncontrolled   . Arthritis   . COPD (chronic obstructive pulmonary disease)   . Hypertension   . Hyperlipidemia   . Vitamin D deficiency    Allergies:  Allergies  Allergen Reactions  . Ace Inhibitors Cough  . Sulfonamide Derivatives     REACTION: upset stomach  . Zoloft [Sertraline Hcl] Diarrhea     Review of Systems:  Review of Systems  Constitutional: Negative.   HENT: Negative.    Respiratory: Negative.   Cardiovascular: Negative.   Gastrointestinal: Negative.   Genitourinary: Negative.   Musculoskeletal: Negative.   Skin: Negative.   Neurological: Positive for dizziness (with standing) and sensory change (bilateral feet). Negative for tingling, tremors, speech change, focal weakness, seizures and loss of consciousness.  Psychiatric/Behavioral: Positive for memory loss. Negative for depression, suicidal ideas, hallucinations and substance abuse. The patient is not nervous/anxious and does not have insomnia.     Family history- Review and unchanged Social history- Review and unchanged Physical Exam: BP 120/70 mmHg  Pulse 72  Temp(Src) 98.4 F (36.9 C)  Resp 16  Ht 5\' 3"  (1.6 m)  Wt 123 lb (55.792 kg)  BMI 21.79 kg/m2 Wt Readings from Last 3 Encounters:  08/14/14 123 lb (55.792 kg)  04/30/14 126 lb 9.6 oz (57.425 kg)  01/22/14 126 lb (57.153 kg)   General Appearance: Well nourished, in no apparent distress. Eyes: PERRLA, EOMs, conjunctiva no swelling or erythema Sinuses: No Frontal/maxillary tenderness ENT/Mouth: Ext aud canals clear, TMs without erythema, bulging. No erythema, swelling, or exudate on post pharynx.  Tonsils not swollen or erythematous. Hearing normal.  Neck: Supple, thyroid normal.  Respiratory: Respiratory effort normal, BS equal bilaterally without rales, rhonchi, wheezing or stridor.  Cardio: RRR with no MRGs. Brisk peripheral pulses without edema.  Abdomen: Soft, + BS.  Non tender, no guarding, rebound, hernias, masses. Lymphatics: Non tender without lymphadenopathy.  Musculoskeletal: Full ROM, 4/5 strength, gait wide based.  Skin: Warm, dry without rashes, lesions, ecchymosis.  Neuro: Cranial nerves intact. No cerebellar symptoms. Sensation decreased bilateral feet.  Psych: Awake and oriented X 3, normal affect, Insight and Judgment appropriate, short term memory poor, long term memory intact.    Vicie Mutters, PA-C 1:45  PM [facility]

## 2014-08-15 LAB — CBC WITH DIFFERENTIAL/PLATELET
Basophils Absolute: 0 10*3/uL (ref 0.0–0.1)
Basophils Relative: 0 % (ref 0–1)
Eosinophils Absolute: 0 10*3/uL (ref 0.0–0.7)
Eosinophils Relative: 0 % (ref 0–5)
HEMATOCRIT: 44.9 % (ref 39.0–52.0)
HEMOGLOBIN: 15.2 g/dL (ref 13.0–17.0)
LYMPHS ABS: 1.4 10*3/uL (ref 0.7–4.0)
LYMPHS PCT: 16 % (ref 12–46)
MCH: 30.2 pg (ref 26.0–34.0)
MCHC: 33.9 g/dL (ref 30.0–36.0)
MCV: 89.3 fL (ref 78.0–100.0)
MONO ABS: 0.5 10*3/uL (ref 0.1–1.0)
MPV: 9.9 fL (ref 9.4–12.4)
Monocytes Relative: 5 % (ref 3–12)
NEUTROS ABS: 7.1 10*3/uL (ref 1.7–7.7)
Neutrophils Relative %: 79 % — ABNORMAL HIGH (ref 43–77)
Platelets: 234 10*3/uL (ref 150–400)
RBC: 5.03 MIL/uL (ref 4.22–5.81)
RDW: 14.3 % (ref 11.5–15.5)
WBC: 9 10*3/uL (ref 4.0–10.5)

## 2014-08-15 LAB — VITAMIN D 25 HYDROXY (VIT D DEFICIENCY, FRACTURES): Vit D, 25-Hydroxy: 42 ng/mL (ref 30–100)

## 2014-08-15 LAB — HEMOGLOBIN A1C
HEMOGLOBIN A1C: 7 % — AB (ref <5.7)
Mean Plasma Glucose: 154 mg/dL — ABNORMAL HIGH (ref <117)

## 2014-09-18 ENCOUNTER — Ambulatory Visit (INDEPENDENT_AMBULATORY_CARE_PROVIDER_SITE_OTHER): Payer: Medicare Other | Admitting: Internal Medicine

## 2014-09-18 ENCOUNTER — Encounter: Payer: Self-pay | Admitting: Internal Medicine

## 2014-09-18 VITALS — BP 140/76 | HR 68 | Temp 97.7°F | Resp 16 | Ht 63.0 in | Wt 123.4 lb

## 2014-09-18 DIAGNOSIS — E1122 Type 2 diabetes mellitus with diabetic chronic kidney disease: Secondary | ICD-10-CM

## 2014-09-18 DIAGNOSIS — E1140 Type 2 diabetes mellitus with diabetic neuropathy, unspecified: Secondary | ICD-10-CM | POA: Insufficient documentation

## 2014-09-18 DIAGNOSIS — N182 Chronic kidney disease, stage 2 (mild): Secondary | ICD-10-CM

## 2014-09-18 DIAGNOSIS — I10 Essential (primary) hypertension: Secondary | ICD-10-CM

## 2014-09-18 MED ORDER — AMITRIPTYLINE HCL 25 MG PO TABS: ORAL_TABLET | ORAL | Status: DC

## 2014-09-18 NOTE — Patient Instructions (Signed)
Increase Lyrica to 2 capsules at bedtime  +++++++++++++++++++++++++++++++++++++++  Diabetic Neuropathy Diabetic neuropathy is a nerve disease or nerve damage that is caused by diabetes mellitus. About half of all people with diabetes mellitus have some form of nerve damage. Nerve damage is more common in those who have had diabetes mellitus for many years and who generally have not had good control of their blood sugar (glucose) level. Diabetic neuropathy is a common complication of diabetes mellitus. There are three more common types of diabetic neuropathy and a fourth type that is less common and less understood:   Peripheral neuropathy--This is the most common type of diabetic neuropathy. It causes damage to the nerves of the feet and legs first and then eventually the hands and arms.The damage affects the ability to sense touch.  Autonomic neuropathy--This type causes damage to the autonomic nervous system, which controls the following functions:  Heartbeat.  Body temperature.  Blood pressure.  Urination.  Digestion.  Sweating.  Sexual function.  Focal neuropathy--Focal neuropathy can be painful and unpredictable and occurs most often in older adults with diabetes mellitus. It involves a specific nerve or one area and often comes on suddenly. It usually does not cause long-term problems.  Radiculoplexus neuropathy-- Sometimes called lumbosacral radiculoplexus neuropathy, radiculoplexus neuropathy affects the nerves of the thighs, hips, buttocks, or legs. It is more common in people with type 2 diabetes mellitus and in older men. It is characterized by debilitating pain, weakness, and atrophy, usually in the thigh muscles. CAUSES  The cause of peripheral, autonomic, and focal neuropathies is diabetes mellitus that is uncontrolled and high glucose levels. The cause of radiculoplexus neuropathy is unknown. However, it is thought to be caused by inflammation related to uncontrolled  glucose levels. SIGNS AND SYMPTOMS  Peripheral Neuropathy Peripheral neuropathy develops slowly over time. When the nerves of the feet and legs no longer work there may be:   Burning, stabbing, or aching pain in the legs or feet.  Inability to feel pressure or pain in your feet. This can lead to:  Thick calluses over pressure areas.  Pressure sores.  Ulcers.  Foot deformities.  Reduced ability to feel temperature changes.  Muscle weakness. Autonomic Neuropathy The symptoms of autonomic neuropathy vary depending on which nerves are affected. Symptoms may include:  Problems with digestion, such as:  Feeling sick to your stomach (nausea).  Vomiting.  Bloating.  Constipation.  Diarrhea.  Abdominal pain.  Difficulty with urination. This occurs if you lose your ability to sense when your bladder is full. Problems include:  Urine leakage (incontinence).  Inability to empty your bladder completely (retention).  Rapid or irregular heartbeat (palpitations).  Blood pressure drops when you stand up (orthostatic hypotension). When you stand up you may feel:  Dizzy.  Weak.  Faint.  In men, inability to attain and maintain an erection.  In women, vaginal dryness and problems with decreased sexual desire and arousal.  Problems with body temperature regulation.  Increased or decreased sweating. Focal Neuropathy  Abnormal eye movements or abnormal alignment of both eyes.  Weakness in the wrist.  Foot drop. This results in an inability to lift the foot properly and abnormal walking or foot movement.  Paralysis on one side of your face (Bell palsy).  Chest or abdominal pain. Radiculoplexus Neuropathy  Sudden, severe pain in your hip, thigh, or buttocks.  Weakness and wasting of thigh muscles.  Difficulty rising from a seated position.  Abdominal swelling.  Unexplained weight loss (usually more than  10 lb [4.5 kg]). DIAGNOSIS  Peripheral Neuropathy Your  senses may be tested. Sensory function testing can be done with:  A light touch using a monofilament.  A vibration with tuning fork.  A sharp sensation with a pin prick. Other tests that can help diagnose neuropathy are:  Nerve conduction velocity. This test checks the transmission of an electrical current through a nerve.  Electromyography. This shows how muscles respond to electrical signals transmitted by nearby nerves.  Quantitative sensory testing. This is used to assess how your nerves respond to vibrations and changes in temperature. Autonomic Neuropathy Diagnosis is often based on reported symptoms. Tell your health care provider if you experience:   Dizziness.   Constipation.   Diarrhea.   Inappropriate urination or inability to urinate.   Inability to get or maintain an erection.  Tests that may be done include:   Electrocardiography or Holter monitor. These are tests that can help show problems with the heart rate or heart rhythm.   An X-ray exam may be done. Focal Neuropathy Diagnosis is made based on your symptoms and what your health care provider finds during your exam. Other tests may be done. They may include:  Nerve conduction velocities. This checks the transmission of electrical current through a nerve.  Electromyography. This shows how muscles respond to electrical signals transmitted by nearby nerves.  Quantitative sensory testing. This test is used to assess how your nerves respond to vibration and changes in temperature. Radiculoplexus Neuropathy  Often the first thing is to eliminate any other issue or problems that might be the cause, as there is no stick test for diagnosis.  X-ray exam of your spine and lumbar region.  Spinal tap to rule out cancer.  MRI to rule out other lesions. TREATMENT  Once nerve damage occurs, it cannot be reversed. The goal of treatment is to keep the disease or nerve damage from getting worse and affecting  more nerve fibers. Controlling your blood glucose level is the key. Most people with radiculoplexus neuropathy see at least a partial improvement over time. You will need to keep your blood glucose and HbA1c levels in the target range determined by your health care provider. Things that help control blood glucose levels include:   Blood glucose monitoring.   Meal planning.   Physical activity.   Diabetes medicine.  Over time, maintaining lower blood glucose levels helps lessen symptoms. Sometimes, prescription pain medicine is needed. HOME CARE INSTRUCTIONS:  Do not smoke.  Keep your blood glucose level in the range that you and your health care provider have determined acceptable for you.  Keep your blood pressure level in the range that you and your health care provider have determined acceptable for you.  Eat a well-balanced diet.  Be active every day.  Check your feet every day. SEEK MEDICAL CARE IF:   You have burning, stabbing, or aching pain in the legs or feet.  You are unable to feel pressure or pain in your feet.  You develop problems with digestion such as:  Nausea.  Vomiting.  Bloating.  Constipation.  Diarrhea.  Abdominal pain.  You have difficulty with urination, such as:  Incontinence.  Retention.  You have palpitations.  You develop orthostatic hypotension. When you stand up you may feel:  Dizzy.  Weak.  Faint.  You cannot attain and maintain an erection (in men).  You have vaginal dryness and problems with decreased sexual desire and arousal (in women).  You have severe pain in  your thighs, legs, or buttocks.  You have unexplained weight loss. Document Released: 10/24/2001 Document Revised: 06/05/2013 Document Reviewed: 01/24/2013 [facility] Patient Information 2015 [place], [place]. This information is not intended to replace advice given to you by your health care provider. Make sure you discuss any questions you have with your  health care provider.

## 2014-09-20 NOTE — Progress Notes (Signed)
Subjective:    Patient ID: Tim Lyons, male    DOB: [birth year], 74 y.o.   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  He reports the painful burning sensation is worse at night and worse in the right greater than the left foot.   Medication Sig  . aspirin 81 MG tablet Take 81 mg by mouth daily.    Marland Kitchen atenolol (TENORMIN) 100 MG tablet Take 100 mg by mouth daily.  . Cholecalciferol (VITAMIN D PO) Take 2,000 Units by mouth daily.   Marland Kitchen doxazosin (CARDURA) 8 MG tablet TAKE ONE TABLET BY MOUTH NIGHTLY AT BEDTIME FOR BLOOD PRESSURE  . metFORMIN (GLUCOPHAGE-XR) 500 MG 24 hr tablet Take 1 tablet by mouth twice daily at breakfast & lunch and 2 tablets at supper time.  . pregabalin (LYRICA) 75 MG capsule Take 1 capsule (75 mg total) by mouth 3 (three) times daily.  . verapamil (CALAN) 120 MG tablet Take 1 tablet (120 mg total) by mouth 2 (two) times daily.  Marland Kitchen HYDROcodone-acetaminophen (NORCO/VICODIN) 5-325 MG per tablet Take 1 tablet by mouth every 6 (six) hours as needed for moderate pain.   Allergies  Allergen Reactions  . Ace Inhibitors Cough  . Sulfonamide Derivatives     REACTION: upset stomach  . Zoloft [Sertraline Hcl] Diarrhea   Past Medical History  Diagnosis Date  . CAD (coronary artery disease)   . Polycythemia   . Bilirubinemia   . PAD (peripheral artery disease)   . Hernia   . Diverticulitis   . Type II or unspecified type diabetes mellitus without mention of complication, not stated as uncontrolled   . Arthritis   . COPD (chronic obstructive pulmonary disease)   . Hypertension   . Hyperlipidemia   . Vitamin D deficiency    Review of Systems  In addition to the HPI above,  No Headache, No changes with Vision or hearing,  No problems swallowing food or Liquids,  No Chest  pain or productive Cough or Shortness of Breath,  No Abdominal pain, No Nausea or Vomitting, Bowel movements are regular,  No Blood in stool or Urine,  No dysuria,  No new skin rashes or bruises,  No new joints pains-aches,  No recent weight loss,  No polyuria, polydypsia or polyphagia,  No significant Mental Stressors.  A full 10 point Review of Systems was done, except as stated above, all other Review of Systems were negative    Objective:   Physical Exam BP 140/76   P 68  T 97.7 F   Resp 16  Ht 5\' 3"    Wt 123 lb 6.4 oz  BMI 21.86  HEENT - Eac's patent. TM's Nl. EOM's full. PERRLA. NasoOroPharynx clear. Neck - supple. Nl Thyroid. Carotids 2+ & No bruits, nodes, JVD Chest - Clear equal BS w/o Rales, rhonchi, wheezes. Cor - Nl HS. RRR w/o sig MGR. PP 1(+). No edema. Abd - No palpable organomegaly, masses or tenderness. BS nl. MS- FROM w/o deformities. Muscle power, tone and bulk Nl. Gait Nl. Neuro - No obvious Cr N abnormalities. Motor and Cerebellar functions appear Nl w/o focal abnormalities. Sensory testing decreased to touch, 2 point discrimination by monofilament testing to the ankles bilat.  Psyche - Mental status normal & appropriate.  No delusions, ideations  or obvious mood abnormalities.    Assessment & Plan:   1. Type 2 diabetes mellitus with diabetic neuropathy  2. Essential hypertension  3. CKD stage 2 due to type 2 diabetes mellitus  - Rx Amitriptyline 25 mg #90 - start 1-3 qHS & also try increase the Lyrica Sx's to 2 caps qpm.

## 2014-09-23 ENCOUNTER — Ambulatory Visit (INDEPENDENT_AMBULATORY_CARE_PROVIDER_SITE_OTHER): Payer: Medicare Other | Admitting: Physician Assistant

## 2014-09-23 ENCOUNTER — Encounter: Payer: Self-pay | Admitting: Physician Assistant

## 2014-09-23 VITALS — BP 138/64 | HR 88 | Temp 98.6°F | Resp 16 | Ht 63.0 in | Wt 124.0 lb

## 2014-09-23 DIAGNOSIS — R413 Other amnesia: Secondary | ICD-10-CM

## 2014-09-23 DIAGNOSIS — E1140 Type 2 diabetes mellitus with diabetic neuropathy, unspecified: Secondary | ICD-10-CM

## 2014-09-23 DIAGNOSIS — W19XXXA Unspecified fall, initial encounter: Secondary | ICD-10-CM

## 2014-09-23 DIAGNOSIS — M5441 Lumbago with sciatica, right side: Secondary | ICD-10-CM

## 2014-09-23 MED ORDER — PREDNISONE 10 MG PO TABS: ORAL_TABLET | ORAL | Status: AC

## 2014-09-23 NOTE — Patient Instructions (Addendum)
-Take Prednisone as prescribed. -Take Lyrica as prescribed for nerve pain.  Please keep appt on 11/18/14.  Back Pain, Adult Low back pain is very common. About 1 in 5 people have back pain.The cause of low back pain is rarely dangerous. The pain often gets better over time.About half of people with a sudden onset of back pain feel better in just 2 weeks. About 8 in 10 people feel better by 6 weeks.  CAUSES Some common causes of back pain include:  Strain of the muscles or ligaments supporting the spine.  Wear and tear (degeneration) of the spinal discs.  Arthritis.  Direct injury to the back. DIAGNOSIS Most of the time, the direct cause of low back pain is not known.However, back pain can be treated effectively even when the exact cause of the pain is unknown.Answering your caregiver's questions about your overall health and symptoms is one of the most accurate ways to make sure the cause of your pain is not dangerous. If your caregiver needs more information, he or she may order lab work or imaging tests (X-rays or MRIs).However, even if imaging tests show changes in your back, this usually does not require surgery. HOME CARE INSTRUCTIONS For many people, back pain returns.Since low back pain is rarely dangerous, it is often a condition that people can learn to [facility] their own.   Remain active. It is stressful on the back to sit or stand in one place. Do not sit, drive, or stand in one place for more than 30 minutes at a time. Take short walks on level surfaces as soon as pain allows.Try to increase the length of time you walk each day.  Do not stay in bed.Resting more than 1 or 2 days can delay your recovery.  Do not avoid exercise or work.Your body is made to move.It is not dangerous to be active, even though your back may hurt.Your back will likely heal faster if you return to being active before your pain is gone.  Pay attention to your body when you bend and lift.  Many people have less discomfortwhen lifting if they bend their knees, keep the load close to their bodies,and avoid twisting. Often, the most comfortable positions are those that put less stress on your recovering back.  Find a comfortable position to sleep. Use a firm mattress and lie on your side with your knees slightly bent. If you lie on your back, put a pillow under your knees.  Only take over-the-counter or prescription medicines as directed by your caregiver. Over-the-counter medicines to reduce pain and inflammation are often the most helpful.Your caregiver may prescribe muscle relaxant drugs.These medicines help dull your pain so you can more quickly return to your normal activities and healthy exercise.  Put ice on the injured area.  Put ice in a plastic bag.  Place a towel between your skin and the bag.  Leave the ice on for 15-20 minutes, 03-04 times a day for the first 2 to 3 days. After that, ice and heat may be alternated to reduce pain and spasms.  Ask your caregiver about trying back exercises and gentle massage. This may be of some benefit.  Avoid feeling anxious or stressed.Stress increases muscle tension and can worsen back pain.It is important to recognize when you are anxious or stressed and learn ways to manage it.Exercise is a great option. SEEK MEDICAL CARE IF:  You have pain that is not relieved with rest or medicine.  You have pain that does  not improve in 1 week.  You have new symptoms.  You are generally not feeling well. SEEK IMMEDIATE MEDICAL CARE IF:   You have pain that radiates from your back into your legs.  You develop new bowel or bladder control problems.  You have unusual weakness or numbness in your arms or legs.  You develop nausea or vomiting.  You develop abdominal pain.  You feel faint. Document Released: 08/15/2005 Document Revised: 02/14/2012 Document Reviewed: 12/17/2013 [facility] Patient Information 2015 [place], [place].  This information is not intended to replace advice given to you by your health care provider. Make sure you discuss any questions you have with your health care provider.

## 2014-09-23 NOTE — Progress Notes (Signed)
HPI A Caucasian 74 y.o. male presents for back pain and recurrent foot pain.  He states the back pain started 11 years ago and comes and goes.  Patient has h/o diabetes and has diabetic neuropathy.  Patient was seen 08/14/14 and 09/18/14 for foot pain and was given Lyrica to try.  Patient states the Lyrica is not helping.  Patient's Lyrica dose was recently increased to 2 capsules at night.  He was also prescribed Amitriptyline 25mg - 1-3 tablets at night.  The dull achy pain is constant and is a 8 out of 10 right now.  Nothing alleviates the pain and nothing aggravates the pain.  No radiation of pain.  He reports numbness and tingling in the right foot and notices it more in the morning.  He also reports recent fall that happened 3 weeks ago.  He states he was going down stairs outside and slipped and hit the back of his head and did have LOC for couple minutes.  Patient did not go to ED.  Patient does have memory issues and states he cannot remember things that happened couple days ago.  He does have trouble writing and notices his handwriting is "shaky".  He also states that it takes him little longer to get up and starting moving and does have trouble getting around/walking.  He did have Lumbar Spine x-ray on 06/07/14 that showed "IMPRESSION: No acute fracture or dislocation. Osteoarthritic changes of lumbar spine not significantly changed compared to prior exam."  He reports foot pain, back pain, numbness, tingling, memory loss, trouble walking, trouble writing and sometimes confusion.  He denies fever, chills, sweats, fatigue, cough, SOB, CP, abdominal pain, nausea, vomiting, diarrhea, constipation, rashes, urinary issues, decreased ROM, muscle weakness, headaches, lightheadedness and dizziness.    Review of Systems  All other systems reviewed and are negative.  Current Medications: Current Outpatient Prescriptions on File Prior to Visit  Medication Sig Dispense Refill  . amitriptyline (ELAVIL) 25 MG  tablet Take 1 to 2 tablets at bedtime for Neuropathy Pain 60 tablet 99  . aspirin 81 MG tablet Take 81 mg by mouth daily.      Marland Kitchen atenolol (TENORMIN) 100 MG tablet Take 100 mg by mouth daily.    . Cholecalciferol (VITAMIN D PO) Take 2,000 Units by mouth daily.     Marland Kitchen doxazosin (CARDURA) 8 MG tablet TAKE ONE TABLET BY MOUTH NIGHTLY AT BEDTIME FOR BLOOD PRESSURE 90 tablet 3  . metFORMIN (GLUCOPHAGE-XR) 500 MG 24 hr tablet Take 1 tablet by mouth twice daily at breakfast & lunch and 2 tablets at supper time. 360 tablet 2  . pregabalin (LYRICA) 75 MG capsule Take 1 capsule (75 mg total) by mouth 3 (three) times daily. 30 capsule 0  . verapamil (CALAN) 120 MG tablet Take 1 tablet (120 mg total) by mouth 2 (two) times daily. 180 tablet PRN   No current facility-administered medications on file prior to visit.   Allergies:  Allergies  Allergen Reactions  . Ace Inhibitors Cough  . Sulfonamide Derivatives     REACTION: upset stomach  . Zoloft [Sertraline Hcl] Diarrhea   Medical History:  Past Medical History  Diagnosis Date  . CAD (coronary artery disease)   . Polycythemia   . Bilirubinemia   . PAD (peripheral artery disease)   . Hernia   . Diverticulitis   . Type II or unspecified type diabetes mellitus without mention of complication, not stated as uncontrolled   . Arthritis   . COPD (chronic obstructive  pulmonary disease)   . Hypertension   . Hyperlipidemia   . Vitamin D deficiency    Surgical History:  Past Surgical History  Procedure Laterality Date  . Carotid endarterectomy    . Coronary artery bypass graft    . [employer]    . Heart bypass  1995  . Pacemaker insertion    . Colon surgery     Social History:   History  Substance Use Topics  . Smoking status: Current Every Day Smoker -- 0.50 packs/day for 57 years  . Smokeless tobacco: Not on file     Comment: He did quit 9 months ago, bu started smoking 1 pack per week. Smoked since age 37.  Marland Kitchen Alcohol Use: Yes     Comment:  2 beers but rarely   Physical Exam: Wt Readings from Last 3 Encounters:  09/23/14 124 lb (56.246 kg)  09/18/14 123 lb 6.4 oz (55.974 kg)  08/14/14 123 lb (55.792 kg)   BP 138/64 mmHg  Pulse 88  Temp(Src) 98.6 F (37 C) (Temporal)  Resp 16  Ht 5\' 3"  (1.6 m)  Wt 124 lb (56.246 kg)  BMI 21.97 kg/m2  SpO2 97%  Vitals Reviewed. General Appearance: Well nourished, in no apparent distress and had pleasant demeanor. Eyes:  PERRLA. EOMI. Conjunctiva is pink without edema, erythema or yellowing.  No scleral icterus. Neck: Full ROM intact. Respiratory: CTAB,  r/r/w or stridor. No increased effort of breathing. Cardio: RRR.   m/r/g.  S1S2nl.   Musculoskeletal and Neuro:  Alert and oriented X3, cooperative.  Mood and affect appropriate to situation.  CN II-XII grossly intact. Patient is able to ambulate well, but little slower than usual.  Gait is antalgic, and did have occasional shuffling. Straight leg raising with dorsiflexion positive on the right for radicular symptoms. Sensory exam in the arms and legs are normal.  Bicep reflexes are normal Brachioradialis reflexes are normal Knee reflexes are normal Strength is normal and symmetric in arms and legs. There is not paraspinal muscle spasm.   There is not midline tenderness.   ROM of spine with normal flexion, extension, lateral range of motion to the right and left, and rotation to the right and left.   Patient is unable to walk with one foot in front of other and on tippy toes without support.   No kyphosis, lordosis or scoliosis. Psych: Insight and Judgment appropriate.  Assessment and Plan 1. Bilateral low back pain with right-sided sciatica and Type 2 Diabetic with Peripheral Neuropathy  - predniSONE (DELTASONE) 10 MG tablet; Take 3 tablets PO for 2 days, then take 2 tablets PO for 2 days, then take 1 tablet PO for 3 days.  Dispense: 13 tablet; Refill: 0 Continue Amitriptyline and Lyrica as prescribed.  2. Fall, initial  encounter Ordered imaging to R/O bruising, hematoma, contusion. - CT Head Wo Contrast; Future  3. Memory loss- Parkinson's vs Alzheimer's  - Ambulatory referral to Neurology- Dr. Carles Collet  Discussed medication effects and SE's.  Pt agreed to treatment plan. Please keep your follow up appt on 11/18/14.  Braxton Weisbecker, Stephani Police, PA-C 8:44 PM [facility]

## 2014-09-26 ENCOUNTER — Ambulatory Visit
Admission: RE | Admit: 2014-09-26 | Discharge: 2014-09-26 | Disposition: A | Discharge status: Home / Self Care | Payer: Medicare Other | Source: Ambulatory Visit | Attending: Physician Assistant | Admitting: Physician Assistant

## 2014-09-26 DIAGNOSIS — W19XXXA Unspecified fall, initial encounter: Secondary | ICD-10-CM

## 2014-10-10 ENCOUNTER — Encounter (HOSPITAL_COMMUNITY): Payer: Self-pay | Admitting: Emergency Medicine

## 2014-10-10 ENCOUNTER — Emergency Department (HOSPITAL_COMMUNITY)
Admission: EM | Admit: 2014-10-10 | Discharge: 2014-10-10 | Disposition: A | Discharge status: Home / Self Care | Payer: Medicare Other | Attending: Emergency Medicine | Admitting: Emergency Medicine

## 2014-10-10 DIAGNOSIS — K5900 Constipation, unspecified: Secondary | ICD-10-CM | POA: Diagnosis present

## 2014-10-10 DIAGNOSIS — Z7982 Long term (current) use of aspirin: Secondary | ICD-10-CM | POA: Insufficient documentation

## 2014-10-10 DIAGNOSIS — I10 Essential (primary) hypertension: Secondary | ICD-10-CM | POA: Diagnosis not present

## 2014-10-10 DIAGNOSIS — E119 Type 2 diabetes mellitus without complications: Secondary | ICD-10-CM | POA: Insufficient documentation

## 2014-10-10 DIAGNOSIS — J449 Chronic obstructive pulmonary disease, unspecified: Secondary | ICD-10-CM | POA: Insufficient documentation

## 2014-10-10 DIAGNOSIS — Z720 Tobacco use: Secondary | ICD-10-CM | POA: Insufficient documentation

## 2014-10-10 DIAGNOSIS — Z79899 Other long term (current) drug therapy: Secondary | ICD-10-CM | POA: Insufficient documentation

## 2014-10-10 DIAGNOSIS — I2510 Atherosclerotic heart disease of native coronary artery without angina pectoris: Secondary | ICD-10-CM | POA: Insufficient documentation

## 2014-10-10 DIAGNOSIS — E559 Vitamin D deficiency, unspecified: Secondary | ICD-10-CM | POA: Insufficient documentation

## 2014-10-10 DIAGNOSIS — Z862 Personal history of diseases of the blood and blood-forming organs and certain disorders involving the immune mechanism: Secondary | ICD-10-CM | POA: Insufficient documentation

## 2014-10-10 DIAGNOSIS — Z951 Presence of aortocoronary bypass graft: Secondary | ICD-10-CM | POA: Insufficient documentation

## 2014-10-10 DIAGNOSIS — M1990 Unspecified osteoarthritis, unspecified site: Secondary | ICD-10-CM | POA: Diagnosis not present

## 2014-10-10 LAB — BASIC METABOLIC PANEL
ANION GAP: 9 (ref 5–15)
BUN: 17 mg/dL (ref 6–23)
CALCIUM: 9.3 mg/dL (ref 8.4–10.5)
CO2: 23 mmol/L (ref 19–32)
CREATININE: 1.26 mg/dL (ref 0.50–1.35)
Chloride: 102 mmol/L (ref 96–112)
GFR calc Af Amer: 63 mL/min — ABNORMAL LOW (ref >90)
GFR, EST NON AFRICAN AMERICAN: 54 mL/min — AB (ref >90)
Glucose, Bld: 153 mg/dL — ABNORMAL HIGH (ref 70–99)
Potassium: 4.3 mmol/L (ref 3.5–5.1)
Sodium: 134 mmol/L — ABNORMAL LOW (ref 135–145)

## 2014-10-10 LAB — CBC WITH DIFFERENTIAL/PLATELET
BASOS ABS: 0 10*3/uL (ref 0.0–0.1)
BASOS PCT: 0 % (ref 0–1)
Eosinophils Absolute: 0 10*3/uL (ref 0.0–0.7)
Eosinophils Relative: 0 % (ref 0–5)
HEMATOCRIT: 42 % (ref 39.0–52.0)
Hemoglobin: 15.1 g/dL (ref 13.0–17.0)
Lymphocytes Relative: 7 % — ABNORMAL LOW (ref 12–46)
Lymphs Abs: 1 10*3/uL (ref 0.7–4.0)
MCH: 32.1 pg (ref 26.0–34.0)
MCHC: 36 g/dL (ref 30.0–36.0)
MCV: 89.2 fL (ref 78.0–100.0)
MONO ABS: 0.5 10*3/uL (ref 0.1–1.0)
Monocytes Relative: 4 % (ref 3–12)
Neutro Abs: 12.4 10*3/uL — ABNORMAL HIGH (ref 1.7–7.7)
Neutrophils Relative %: 89 % — ABNORMAL HIGH (ref 43–77)
Platelets: 231 10*3/uL (ref 150–400)
RBC: 4.71 MIL/uL (ref 4.22–5.81)
RDW: 12.8 % (ref 11.5–15.5)
WBC: 13.9 10*3/uL — ABNORMAL HIGH (ref 4.0–10.5)

## 2014-10-10 MED ORDER — DOCUSATE SODIUM 100 MG PO CAPS: 100.0000 mg | ORAL_CAPSULE | Freq: Two times a day (BID) | ORAL | Status: AC

## 2014-10-10 MED ORDER — POLYETHYLENE GLYCOL 3350 17 G PO PACK: 17.0000 g | PACK | Freq: Every day | ORAL | Status: AC

## 2014-10-10 NOTE — ED Notes (Signed)
Patient on [facility], patient unable to produce results from enema

## 2014-10-10 NOTE — ED Notes (Signed)
Per EMS- A friend went to check on patient and called EMS due to patient having abdominal distention and constipation x 2 days. Patient reports that he tried to disimpact himself and caused a small amount of rectal bleeding. Patient lives alone. Abdomen tender when palpated.

## 2014-10-10 NOTE — ED Provider Notes (Signed)
CSN: [id number]     Arrival date & time 10/10/14  1855 History   First MD Initiated Contact with Patient 10/10/14 2009     Chief Complaint  Patient presents with  . Constipation     (Consider location/radiation/quality/duration/timing/severity/associated sxs/prior Treatment) HPI  74 year old male presents with constipation. EMS was called by the neighbor after he saw the patient having trouble going to the bathroom today. The patient states that he had a normal bowel movement yesterday or possibly the day before. He normally goes every day. He does not usually have problems with constipation. He stuck his finger up his anus to see if he pulled the stool out. After this he had a little bit of bright red blood. Patient denies a nausea, vomiting, or abdominal pain. EMS states the patient has been having abdominal distention but the patient states this is untrue. He lives at home alone. No prior illnesses or other complaints.  Past Medical History  Diagnosis Date  . CAD (coronary artery disease)   . Polycythemia   . Bilirubinemia   . PAD (peripheral artery disease)   . Hernia   . Diverticulitis   . Type II or unspecified type diabetes mellitus without mention of complication, not stated as uncontrolled   . Arthritis   . COPD (chronic obstructive pulmonary disease)   . Hypertension   . Hyperlipidemia   . Vitamin D deficiency    Past Surgical History  Procedure Laterality Date  . Carotid endarterectomy    . Coronary artery bypass graft    . [employer]    . Heart bypass  1995  . Pacemaker insertion    . Colon surgery     Family History  Problem Relation Age of Onset  . Hypertension Mother   . Emphysema Father    History  Substance Use Topics  . Smoking status: Current Every Day Smoker -- 0.50 packs/day for 57 years  . Smokeless tobacco: Not on file     Comment: He did quit 9 months ago, bu started smoking 1 pack per week. Smoked since age 69.  Marland Kitchen Alcohol Use: Yes     Comment: 2  beers but rarely    Review of Systems  Constitutional: Negative for fever.  Respiratory: Negative for shortness of breath.   Cardiovascular: Negative for chest pain.  Gastrointestinal: Positive for constipation. Negative for nausea, vomiting, abdominal pain, diarrhea and abdominal distention.  Musculoskeletal: Negative for back pain.  All other systems reviewed and are negative.     Allergies  Ace inhibitors; Sulfonamide derivatives; and Zoloft  Home Medications   Prior to Admission medications   Medication Sig Start Date End Date Taking? Authorizing Provider  amitriptyline (ELAVIL) 25 MG tablet Take 1 to 2 tablets at bedtime for Neuropathy Pain Patient taking differently: Take 25 mg by mouth at bedtime. Take 1 Tablet By Mouth at Bedtime for Neuropathy. 09/18/14  Yes Unk Pinto, MD  aspirin 81 MG tablet Take 81 mg by mouth daily.     Yes Historical Provider, MD  Cholecalciferol (VITAMIN D PO) Take 2,000 Units by mouth daily.    Yes Historical Provider, MD  doxazosin (CARDURA) 8 MG tablet TAKE ONE TABLET BY MOUTH NIGHTLY AT BEDTIME FOR BLOOD PRESSURE 06/23/14  Yes Unk Pinto, MD  metFORMIN (GLUCOPHAGE-XR) 500 MG 24 hr tablet Take 1 tablet by mouth twice daily at breakfast & lunch and 2 tablets at supper time. Patient taking differently: Take 1 Tablet By [facility] in the am and Take 1 Tablet By  Mouth in the pm 12/20/13  Yes Unk Pinto, MD  pregabalin (LYRICA) 75 MG capsule Take 1 capsule (75 mg total) by mouth 3 (three) times daily. Patient taking differently: Take 75 mg by mouth at bedtime.  08/14/14 08/14/15 Yes Vicie Mutters, PA-C  verapamil (CALAN) 120 MG tablet Take 1 tablet (120 mg total) by mouth 2 (two) times daily. 03/05/14  Yes Unk Pinto, MD   BP 186/84 mmHg  Pulse 89  Temp(Src) 97.6 F (36.4 C) (Oral)  Resp 18  SpO2 100% Physical Exam  Constitutional: He is oriented to person, place, and time. He appears well-developed and well-nourished.  HENT:  Head:  Normocephalic and atraumatic.  Right Ear: External ear normal.  Left Ear: External ear normal.  Nose: Nose normal.  Eyes: Right eye exhibits no discharge. Left eye exhibits no discharge.  Neck: Neck supple.  Cardiovascular: Normal rate, regular rhythm, normal heart sounds and intact distal pulses.   Pulmonary/Chest: Effort normal.  Abdominal: Soft. He exhibits no distension. There is no tenderness.  Genitourinary:  Hard stool in rectal vault that is too high up for me to be able to bring out. No gross blood.  Musculoskeletal: He exhibits no edema.  Neurological: He is alert and oriented to person, place, and time.  Skin: Skin is warm and dry.  Nursing note and vitals reviewed.   ED Course  Procedures (including critical care time) Labs Review Labs Reviewed  BASIC METABOLIC PANEL - Abnormal; Notable for the following:    Sodium 134 (*)    Glucose, Bld 153 (*)    GFR calc non Af Amer 54 (*)    GFR calc Af Amer 63 (*)    All other components within normal limits  CBC WITH DIFFERENTIAL/PLATELET - Abnormal; Notable for the following:    WBC 13.9 (*)    Neutrophils Relative % 89 (*)    Neutro Abs 12.4 (*)    Lymphocytes Relative 7 (*)    All other components within normal limits    Imaging Review No results found.   EKG Interpretation None      MDM   Final diagnoses:  Constipation, unspecified constipation type    Patient with constipation for one or possibly 2 days. Patient is well-appearing, does have hard stool but unable to be relieved manually due to location. Patient has no abdominal distention, pain or vomiting to suggest emergent pathology. At this time will recommend enema, miralax, stool softeners, etc. Given he has only been constipated a short while will also recommend dietary changes and f/u with PCP.    Ephraim Hamburger, MD 10/10/14 2230

## 2014-10-10 NOTE — ED Notes (Signed)
Patient up to [facility] with assistance to have bowel movement.

## 2014-10-10 NOTE — ED Notes (Signed)
Patient states he is constipated X2 days. Patient states he had a "normal" bowel movement 2 days ago.  A&OX4 at this time. Patient also c/o right leg pain.

## 2014-11-04 ENCOUNTER — Ambulatory Visit: Payer: Self-pay | Admitting: Neurology

## 2014-11-06 ENCOUNTER — Encounter: Payer: Self-pay | Admitting: Neurology

## 2014-11-06 ENCOUNTER — Telehealth: Payer: Self-pay | Admitting: Neurology

## 2014-11-06 NOTE — Telephone Encounter (Signed)
Pt no showed 11/04/14 NP appt w/ Dr. Delice Lesch. No show letter + policy mailed to pt. Referring provider office notified via EPIC referral note / Sherri S.

## 2014-11-10 ENCOUNTER — Telehealth: Payer: Self-pay | Admitting: *Deleted

## 2014-11-10 NOTE — Telephone Encounter (Signed)
Daughter called and states she is going to try and get her father into an assisted living facility.  Per Dr Elnita Maxwell, she will need to talk to her father and get his POA and then she will be able to proceed. Patient has an OV here this week and his daughter will be wilth him.

## 2014-11-12 ENCOUNTER — Encounter: Payer: Self-pay | Admitting: Internal Medicine

## 2014-11-12 ENCOUNTER — Ambulatory Visit (INDEPENDENT_AMBULATORY_CARE_PROVIDER_SITE_OTHER): Payer: Medicare Other | Admitting: Internal Medicine

## 2014-11-12 VITALS — BP 118/70 | HR 76 | Temp 98.2°F | Resp 16 | Ht 63.0 in | Wt 116.0 lb

## 2014-11-12 DIAGNOSIS — Z79899 Other long term (current) drug therapy: Secondary | ICD-10-CM

## 2014-11-12 DIAGNOSIS — E1122 Type 2 diabetes mellitus with diabetic chronic kidney disease: Secondary | ICD-10-CM

## 2014-11-12 DIAGNOSIS — N182 Chronic kidney disease, stage 2 (mild): Secondary | ICD-10-CM

## 2014-11-12 DIAGNOSIS — E785 Hyperlipidemia, unspecified: Secondary | ICD-10-CM

## 2014-11-12 DIAGNOSIS — I10 Essential (primary) hypertension: Secondary | ICD-10-CM

## 2014-11-12 DIAGNOSIS — E1140 Type 2 diabetes mellitus with diabetic neuropathy, unspecified: Secondary | ICD-10-CM

## 2014-11-12 DIAGNOSIS — M549 Dorsalgia, unspecified: Secondary | ICD-10-CM

## 2014-11-12 DIAGNOSIS — E559 Vitamin D deficiency, unspecified: Secondary | ICD-10-CM

## 2014-11-12 DIAGNOSIS — G8929 Other chronic pain: Secondary | ICD-10-CM

## 2014-11-12 LAB — CBC WITH DIFFERENTIAL/PLATELET
BASOS PCT: 0 % (ref 0–1)
Basophils Absolute: 0 10*3/uL (ref 0.0–0.1)
Eosinophils Absolute: 0 10*3/uL (ref 0.0–0.7)
Eosinophils Relative: 0 % (ref 0–5)
HCT: 49.5 % (ref 39.0–52.0)
Hemoglobin: 16.8 g/dL (ref 13.0–17.0)
LYMPHS PCT: 10 % — AB (ref 12–46)
Lymphs Abs: 1.4 10*3/uL (ref 0.7–4.0)
MCH: 30.9 pg (ref 26.0–34.0)
MCHC: 33.9 g/dL (ref 30.0–36.0)
MCV: 91.2 fL (ref 78.0–100.0)
MONOS PCT: 4 % (ref 3–12)
MPV: 10.4 fL (ref 8.6–12.4)
Monocytes Absolute: 0.6 10*3/uL (ref 0.1–1.0)
Neutro Abs: 12 10*3/uL — ABNORMAL HIGH (ref 1.7–7.7)
Neutrophils Relative %: 86 % — ABNORMAL HIGH (ref 43–77)
Platelets: 316 10*3/uL (ref 150–400)
RBC: 5.43 MIL/uL (ref 4.22–5.81)
RDW: 13.6 % (ref 11.5–15.5)
WBC: 14 10*3/uL — ABNORMAL HIGH (ref 4.0–10.5)

## 2014-11-12 MED ORDER — HYDROCODONE-ACETAMINOPHEN 5-325 MG PO TABS: 0.5000 | ORAL_TABLET | Freq: Four times a day (QID) | ORAL | Status: DC | PRN

## 2014-11-12 NOTE — Patient Instructions (Signed)

## 2014-11-12 NOTE — Progress Notes (Signed)
Subjective:    Patient ID: Tim Lyons, male    DOB: Nov 25, 1940, 74 y.o.   MRN: [id number]  HPI  Patient presents to the office with his daughter for multiple complaints.  Patient reports that his back has been bothering him for at least three years.  His pain is mostly in his lower back.  He states that the pain in his back is aching.  He reports the pain is completely fine when he is laying still.  It hurts with standing and walking.  He denies shooting sensation down his legs  He does report continued peripheral neuropathy.  He has been taking the lyrica and the elavil with no relief.  He reports that the last back doctor did not help him at all.  His daughter is requesting that he has an MRI done.  He reports no loss of his bowel or bladder, he reports no tingling or numbness in the genitals.  He reports difficulty with stairs.  He reports no problem with belly pain.    Daughter reports that she would like to get her father into an assisted living home.  She is also requesting FMLA paper work and a handicap plate for her family so that her father can get in and out of stores.  She is unsure whether he is taking his medications at home and she is concerned about it. Patient did recently miss an appointment at neurology with Dr. Carles Collet for evaluation of memory care.  Patient cannot remember that he had an appointment.   The patient reports that he has been taking his medications at home.  He reports no issues with his blood pressure or blood sugars at home.    Review of Systems  Constitutional: Negative for fever, chills and fatigue.  Respiratory: Negative for chest tightness and shortness of breath.   Cardiovascular: Negative for chest pain and palpitations.  Gastrointestinal: Negative for nausea, vomiting, abdominal pain, diarrhea and constipation.  Genitourinary: Negative for dysuria, urgency, frequency, hematuria and difficulty urinating.  Musculoskeletal: Positive for back pain. Negative  for arthralgias.  Neurological: Negative for numbness.       Objective:   Physical Exam  Constitutional: He is oriented to person, place, and time. He appears well-developed and well-nourished.  HENT:  Head: Normocephalic and atraumatic.  Mouth/Throat: Oropharynx is clear and moist. No oropharyngeal exudate.  Eyes: Conjunctivae and EOM are normal. Pupils are equal, round, and reactive to light. No scleral icterus.  Neck: Normal range of motion. Neck supple. No JVD present. No thyromegaly present.  Cardiovascular: Normal rate, regular rhythm, normal heart sounds and intact distal pulses.  Exam reveals no gallop and no friction rub.   No murmur heard. Pulmonary/Chest: Effort normal and breath sounds normal. No respiratory distress. He has no wheezes. He has no rales. He exhibits no tenderness.  Abdominal: Soft. Bowel sounds are normal. He exhibits no distension and no mass. There is no tenderness. There is no rebound and no guarding.  Musculoskeletal:  Patient rises slowly from sitting to standing.  They walk without an antalgic gait.  There is no evidence of erythema, ecchymosis, or gross deformity.  There is tenderness to palpation over the left PSIS and some minimal tenderness to palpation over the left paraspinal muscles.  Active ROM is limited due to pain.  Sensation to light touch is intact over all extremities.  Strength is symmetric and equal in all extremities.    Lymphadenopathy:    He has no cervical adenopathy.  Neurological: He is alert and oriented to person, place, and time.  Patient walks with a shuffling gait.  Skin: Skin is warm and dry.  Psychiatric: His behavior is normal. Judgment normal.  Nursing note and vitals reviewed.   Filed Vitals:   11/12/14 1128  BP: 118/70  Pulse: 76  Temp: 98.2 F (36.8 C)  Resp: 16         Assessment & Plan:    1. Essential hypertension -BP well controlled today -DASH diet -exercise as able - TSH  2. Type 2 diabetes  mellitus with diabetic neuropathy -cont metformin, elavil, and lyrica - Hemoglobin A1c - Insulin, fasting  3. CKD stage 2 due to type 2 diabetes mellitus -cont metformin  4. Hyperlipidemia  - Lipid panel  5. Vitamin D deficiency -cont supplement - Vit D  25 hydroxy (rtn osteoporosis monitoring)  6. Medication management  - CBC with Differential/Platelet - BASIC METABOLIC PANEL WITH GFR - Hepatic function panel - Magnesium  7. Chronic back pain  - Ambulatory referral to Orthopedics - likely due to osteoarthritic pain.  No neurological deficits.  No signs of cauda equina.  No value in MRI.  Suspect patient needs PT.  Patient not a good candidate for injections or for surgery. Will see what ortho says.  Pt. Cannot take ultram given interaction with elavil.  Patient declining Norco as he cannot drive on it.    Pt. Needs to be placed in assisted living.  Daughter is going to try to get him in.  Will fill out FMLA papers for his daughter until she can get them into assisted living.

## 2014-11-13 ENCOUNTER — Other Ambulatory Visit: Payer: Self-pay | Admitting: Internal Medicine

## 2014-11-13 LAB — HEPATIC FUNCTION PANEL
ALT: 9 U/L (ref 0–53)
AST: 15 U/L (ref 0–37)
Albumin: 4.2 g/dL (ref 3.5–5.2)
Alkaline Phosphatase: 91 U/L (ref 39–117)
BILIRUBIN DIRECT: 0.1 mg/dL (ref 0.0–0.3)
BILIRUBIN INDIRECT: 0.4 mg/dL (ref 0.2–1.2)
Total Bilirubin: 0.5 mg/dL (ref 0.2–1.2)
Total Protein: 7.2 g/dL (ref 6.0–8.3)

## 2014-11-13 LAB — LIPID PANEL
CHOL/HDL RATIO: 6.1 ratio
CHOLESTEROL: 219 mg/dL — AB (ref 0–200)
HDL: 36 mg/dL — ABNORMAL LOW (ref >=40)
LDL Cholesterol: 127 mg/dL — ABNORMAL HIGH (ref 0–99)
Triglycerides: 282 mg/dL — ABNORMAL HIGH (ref <150)
VLDL: 56 mg/dL — ABNORMAL HIGH (ref 0–40)

## 2014-11-13 LAB — INSULIN, FASTING: INSULIN FASTING, SERUM: 12.8 u[IU]/mL (ref 2.0–19.6)

## 2014-11-13 LAB — BASIC METABOLIC PANEL WITH GFR
BUN: 11 mg/dL (ref 6–23)
CALCIUM: 9.6 mg/dL (ref 8.4–10.5)
CHLORIDE: 97 meq/L (ref 96–112)
CO2: 25 meq/L (ref 19–32)
Creat: 0.97 mg/dL (ref 0.50–1.35)
GFR, Est African American: 88 mL/min
GFR, Est Non African American: 76 mL/min
Glucose, Bld: 358 mg/dL — ABNORMAL HIGH (ref 70–99)
Potassium: 4.1 mEq/L (ref 3.5–5.3)
SODIUM: 136 meq/L (ref 135–145)

## 2014-11-13 LAB — MAGNESIUM: Magnesium: 1.8 mg/dL (ref 1.5–2.5)

## 2014-11-13 LAB — HEMOGLOBIN A1C
Hgb A1c MFr Bld: 8.1 % — ABNORMAL HIGH (ref <5.7)
MEAN PLASMA GLUCOSE: 186 mg/dL — AB (ref <117)

## 2014-11-13 LAB — VITAMIN D 25 HYDROXY (VIT D DEFICIENCY, FRACTURES): Vit D, 25-Hydroxy: 51 ng/mL (ref 30–100)

## 2014-11-13 LAB — TSH: TSH: 2.78 u[IU]/mL (ref 0.350–4.500)

## 2014-11-13 MED ORDER — CANAGLIFLOZIN 100 MG PO TABS: 100.0000 mg | ORAL_TABLET | Freq: Every day | ORAL | Status: AC

## 2014-11-13 MED ORDER — ATORVASTATIN CALCIUM 20 MG PO TABS: 20.0000 mg | ORAL_TABLET | Freq: Every day | ORAL | Status: AC

## 2014-11-13 MED ORDER — METFORMIN HCL ER 500 MG PO TB24: ORAL_TABLET | ORAL | Status: AC

## 2014-11-14 ENCOUNTER — Other Ambulatory Visit: Payer: Self-pay | Admitting: Internal Medicine

## 2014-11-18 ENCOUNTER — Ambulatory Visit: Payer: Self-pay | Admitting: Internal Medicine

## 2014-11-20 ENCOUNTER — Encounter: Payer: Self-pay | Admitting: Internal Medicine

## 2014-12-05 ENCOUNTER — Other Ambulatory Visit: Payer: Self-pay | Admitting: Orthopaedic Surgery

## 2014-12-05 DIAGNOSIS — M545 Low back pain, unspecified: Secondary | ICD-10-CM

## 2014-12-05 DIAGNOSIS — M5126 Other intervertebral disc displacement, lumbar region: Secondary | ICD-10-CM

## 2014-12-06 ENCOUNTER — Other Ambulatory Visit: Payer: Self-pay | Admitting: Internal Medicine

## 2014-12-06 DIAGNOSIS — E1142 Type 2 diabetes mellitus with diabetic polyneuropathy: Secondary | ICD-10-CM

## 2014-12-06 MED ORDER — AMITRIPTYLINE HCL 25 MG PO TABS: ORAL_TABLET | ORAL | Status: AC

## 2015-02-26 ENCOUNTER — Ambulatory Visit: Payer: Medicare Other | Admitting: Internal Medicine

## 2015-02-26 NOTE — Progress Notes (Signed)
Patient ID: Tim Lyons, male   DOB: Jul 12, 1941, 74 y.o.   MRN: [id number]  [name]

## 2015-05-06 ENCOUNTER — Encounter: Payer: Self-pay | Admitting: Internal Medicine

## 2015-05-21 ENCOUNTER — Ambulatory Visit: Payer: Self-pay | Admitting: Internal Medicine

## 2015-06-30 ENCOUNTER — Encounter: Payer: Self-pay | Admitting: Internal Medicine

## 2016-08-01 ENCOUNTER — Encounter: Payer: Self-pay | Admitting: Internal Medicine

## 2017-12-21 ENCOUNTER — Encounter: Payer: Self-pay | Admitting: Gastroenterology

## 2018-07-29 DEATH — deceased
